# Patient Record
Sex: Male | Born: 1985 | Race: White | Hispanic: No | Marital: Single
Health system: Southern US, Community
[De-identification: ages and names within clinical notes are randomized; demographics above are authoritative.]

## PROBLEM LIST (undated history)

## (undated) DIAGNOSIS — I10 Essential (primary) hypertension: Secondary | ICD-10-CM

## (undated) DIAGNOSIS — D229 Melanocytic nevi, unspecified: Secondary | ICD-10-CM

## (undated) DIAGNOSIS — F411 Generalized anxiety disorder: Secondary | ICD-10-CM

## (undated) HISTORY — DX: Melanocytic nevi, unspecified: D22.9

## (undated) HISTORY — DX: Essential (primary) hypertension: I10

## (undated) HISTORY — DX: Generalized anxiety disorder: F41.1

---

## 2015-01-06 ENCOUNTER — Encounter: Payer: Self-pay | Admitting: Family Medicine

## 2015-01-06 ENCOUNTER — Ambulatory Visit (INDEPENDENT_AMBULATORY_CARE_PROVIDER_SITE_OTHER): Payer: BLUE CROSS/BLUE SHIELD | Admitting: Family Medicine

## 2015-01-06 VITALS — BP 146/87 | HR 120 | Ht 72.0 in | Wt 185.0 lb

## 2015-01-06 DIAGNOSIS — Z0000 Encounter for general adult medical examination without abnormal findings: Secondary | ICD-10-CM | POA: Diagnosis not present

## 2015-01-06 DIAGNOSIS — R000 Tachycardia, unspecified: Secondary | ICD-10-CM

## 2015-01-06 DIAGNOSIS — F411 Generalized anxiety disorder: Secondary | ICD-10-CM | POA: Diagnosis not present

## 2015-01-06 DIAGNOSIS — R030 Elevated blood-pressure reading, without diagnosis of hypertension: Secondary | ICD-10-CM

## 2015-01-06 DIAGNOSIS — D179 Benign lipomatous neoplasm, unspecified: Secondary | ICD-10-CM | POA: Diagnosis not present

## 2015-01-06 DIAGNOSIS — I10 Essential (primary) hypertension: Secondary | ICD-10-CM

## 2015-01-06 DIAGNOSIS — IMO0001 Reserved for inherently not codable concepts without codable children: Secondary | ICD-10-CM

## 2015-01-06 HISTORY — DX: Essential (primary) hypertension: I10

## 2015-01-06 HISTORY — DX: Generalized anxiety disorder: F41.1

## 2015-01-06 LAB — CBC
HCT: 44.9 % (ref 39.0–52.0)
HEMOGLOBIN: 15.3 g/dL (ref 13.0–17.0)
MCH: 30.8 pg (ref 26.0–34.0)
MCHC: 34.1 g/dL (ref 30.0–36.0)
MCV: 90.3 fL (ref 78.0–100.0)
MPV: 10.1 fL (ref 8.6–12.4)
Platelets: 290 10*3/uL (ref 150–400)
RBC: 4.97 MIL/uL (ref 4.22–5.81)
RDW: 13.1 % (ref 11.5–15.5)
WBC: 8.2 10*3/uL (ref 4.0–10.5)

## 2015-01-06 MED ORDER — LORAZEPAM 0.5 MG PO TABS: 0.5000 mg | ORAL_TABLET | Freq: Three times a day (TID) | ORAL | Status: DC

## 2015-01-06 NOTE — Assessment & Plan Note (Signed)
Likely related to anxiety. Recheck in one month

## 2015-01-06 NOTE — Patient Instructions (Addendum)
Thank you for coming in today. Return for a nurse visit next month to recheck Blood pressure.  I will call with lab results.    Consider cognitive behavioral therapy.  I'm familiar with the ones in [place] but not [place]. Research and I'll be happy to do a referral if needed   Generalized Anxiety Disorder Generalized anxiety disorder (GAD) is a mental disorder. It interferes with life functions, including relationships, work, and school. GAD is different from normal anxiety, which everyone experiences at some point in their lives in response to specific life events and activities. Normal anxiety actually helps [place] prepare for and get through these life events and activities. Normal anxiety goes away after the event or activity is over.  GAD causes anxiety that is not necessarily related to specific events or activities. It also causes excess anxiety in proportion to specific events or activities. The anxiety associated with GAD is also difficult to control. GAD can vary from mild to severe. People with severe GAD can have intense waves of anxiety with physical symptoms (panic attacks).  SYMPTOMS The anxiety and worry associated with GAD are difficult to control. This anxiety and worry are related to many life events and activities and also occur more days than not for 6 months or longer. People with GAD also have three or more of the following symptoms (one or more in children):  Restlessness.   Fatigue.  Difficulty concentrating.   Irritability.  Muscle tension.  Difficulty sleeping or unsatisfying sleep. DIAGNOSIS GAD is diagnosed through an assessment by your health care provider. Your health care provider will ask you questions aboutyour mood,physical symptoms, and events in your life. Your health care provider may ask you about your medical history and use of alcohol or drugs, including prescription medicines. Your health care provider may also do a  physical exam and blood tests. Certain medical conditions and the use of certain substances can cause symptoms similar to those associated with GAD. Your health care provider may refer you to a mental health specialist for further evaluation. TREATMENT The following therapies are usually used to treat GAD:   Medication. Antidepressant medication usually is prescribed for long-term daily control. Antianxiety medicines may be added in severe cases, especially when panic attacks occur.   Talk therapy (psychotherapy). Certain types of talk therapy can be helpful in treating GAD by providing support, education, and guidance. A form of talk therapy called cognitive behavioral therapy can teach you healthy ways to think about and react to daily life events and activities.  Stress managementtechniques. These include yoga, meditation, and exercise and can be very helpful when they are practiced regularly. A mental health specialist can help determine which treatment is best for you. Some people see improvement with one therapy. However, other people require a combination of therapies. Document Released: 07/30/2012 Document Revised: 08/19/2013 Document Reviewed: 07/30/2012 [facility] Patient Information 2015 [place], [place]. This information is not intended to replace advice given to you by your health care provider. Make sure you discuss any questions you have with your health care provider.

## 2015-01-06 NOTE — Assessment & Plan Note (Addendum)
Likely related to anxiety. EKG essentially normal today. Awaiting formal read. Check TSH

## 2015-01-06 NOTE — Assessment & Plan Note (Signed)
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 Plan for watchful waiting.

## 2015-01-06 NOTE — Assessment & Plan Note (Signed)
Refill lorazepam recommend counseling.

## 2015-01-06 NOTE — Progress Notes (Signed)
Jerome Turner is a 29 y.o. male who presents to [facility]  today for establish care.  Patient is here to establish care and discuss a mass on his back and his anxiety.  1) Back mass: Present for about a week. Patient is a mildly tender mobile nodule in his left SI area. He was seen by urgent care provider who suspected a lipoma. He denies any radiating pain weakness or numbness fevers or chills. He notes she's tried meloxicam and Flexeril which seemed to help a lot.  2) anxiety: Patient has been battling anxiety for multiple years now. He has tried multiple different SSRIs which caused intolerable side effects. He has never tried therapy. He takes lorazepam about once a week for his anxiety.    History reviewed. No pertinent past medical history. History reviewed. No pertinent past surgical history. Social History  Substance Use Topics  . Smoking status: Unknown If Ever Smoked  . Smokeless tobacco: Current User    Types: Chew  . Alcohol Use: No   family history includes Asthma in his maternal grandmother; Cancer in his maternal grandfather; Diabetes in his father, maternal grandfather, and paternal grandfather; Hearing loss in his father; Heart disease in his mother and paternal grandfather; Hypertension in his maternal grandfather and mother; Kidney disease in his paternal grandfather.  ROS as above Medications: Current Outpatient Prescriptions  Medication Sig Dispense Refill  . cyclobenzaprine (FLEXERIL) 10 MG tablet Take 10 mg by mouth 3 (three) times daily as needed for muscle spasms.    Marland Kitchen LORazepam (ATIVAN) 0.5 MG tablet Take 1 tablet (0.5 mg total) by mouth every 8 (eight) hours. 30 tablet 0  . meloxicam (MOBIC) 15 MG tablet Take 15 mg by mouth daily.     No current facility-administered medications for this visit.   Allergies  Allergen Reactions  . Penicillins   . Sulfanilamide      Exam:  BP 146/87 mmHg  Pulse 120  Ht 6' (1.829 m)   Wt 185 lb (83.915 kg)  BMI 25.08 kg/m2 Gen: Well NAD HEENT: EOMI,  MMM Lungs: Normal work of breathing. CTABL Heart: RRR no MRG heart rate at rest was 96 bpm  Abd: NABS, Soft. Nondistended, Nontender Exts: Brisk capillary refill, warm and well perfused.   back: Nontender to midline. Mildly tender mobile nodule left SI joint area. Normal back range of motion normal gait lower. Strength is equal and normal throughout. Psych: Alert and oriented normal affect normal thought process and speech. No SI or HI. GAD 7 was 15.  12-lead EKG shows normal sinus rhythm at 95 bpm. No ST segment elevation or depression. No significant Q waves. Normal EKG. RSR prime pattern likely normal.  No results found for this or any previous visit (from the past 24 hour(s)). No results found.   Please see individual assessment and plan sections.

## 2015-01-07 LAB — COMPLETE METABOLIC PANEL WITH GFR
ALBUMIN: 4.6 g/dL (ref 3.6–5.1)
ALT: 52 U/L — ABNORMAL HIGH (ref 9–46)
AST: 26 U/L (ref 10–40)
Alkaline Phosphatase: 77 U/L (ref 40–115)
BILIRUBIN TOTAL: 1 mg/dL (ref 0.2–1.2)
BUN: 14 mg/dL (ref 7–25)
CHLORIDE: 104 mmol/L (ref 98–110)
CO2: 28 mmol/L (ref 20–31)
CREATININE: 0.76 mg/dL (ref 0.60–1.35)
Calcium: 9.1 mg/dL (ref 8.6–10.3)
GFR, Est African American: >89 mL/min (ref >=60)
GFR, Est Non African American: >89 mL/min (ref >=60)
Glucose, Bld: 66 mg/dL (ref 65–99)
Potassium: 4 mmol/L (ref 3.5–5.3)
SODIUM: 140 mmol/L (ref 135–146)
Total Protein: 6.6 g/dL (ref 6.1–8.1)

## 2015-01-07 LAB — TSH: TSH: 0.944 u[IU]/mL (ref 0.350–4.500)

## 2015-01-07 NOTE — Progress Notes (Signed)
Quick Note:  Labs look normal. ALT is very mildly elevated. Plan to recheck in 3 months. This is likely nothing. ______

## 2016-02-15 ENCOUNTER — Encounter: Payer: Self-pay | Admitting: Family Medicine

## 2016-02-15 ENCOUNTER — Ambulatory Visit (INDEPENDENT_AMBULATORY_CARE_PROVIDER_SITE_OTHER): Payer: BLUE CROSS/BLUE SHIELD | Admitting: Family Medicine

## 2016-02-15 VITALS — BP 137/75 | HR 83 | Wt 198.0 lb

## 2016-02-15 DIAGNOSIS — M62838 Other muscle spasm: Secondary | ICD-10-CM

## 2016-02-15 DIAGNOSIS — S29012A Strain of muscle and tendon of back wall of thorax, initial encounter: Secondary | ICD-10-CM

## 2016-02-15 MED ORDER — METHOCARBAMOL 500 MG PO TABS: 500.0000 mg | ORAL_TABLET | Freq: Three times a day (TID) | ORAL | 0 refills | Status: DC

## 2016-02-15 MED ORDER — OMEPRAZOLE 40 MG PO CPDR: 40.0000 mg | DELAYED_RELEASE_CAPSULE | Freq: Every day | ORAL | 3 refills | Status: DC

## 2016-02-15 MED ORDER — DICLOFENAC SODIUM 75 MG PO TBEC: 75.0000 mg | DELAYED_RELEASE_TABLET | Freq: Two times a day (BID) | ORAL | 0 refills | Status: DC

## 2016-02-15 NOTE — Patient Instructions (Signed)
Thank you for coming in today. Attend Physical Therapy.  Return in 2-4 weeks if not better.  Return sooner if needed.  Use a heating pad.  Use a tens unit.  Come back or go to the emergency room if you notice new weakness new numbness problems walking or bowel or bladder problems.  TENS UNIT: This is helpful for muscle pain and spasm.   Search and Purchase a TENS 7000 2nd edition at www.tenspros.com. It should be less than $30.     TENS unit instructions: Do not shower or bathe with the unit on Turn the unit off before removing electrodes or batteries If the electrodes lose stickiness add a drop of water to the electrodes after they are disconnected from the unit and place on plastic sheet. If you continued to have difficulty, call the TENS unit company to purchase more electrodes. Do not apply lotion on the skin area prior to use. Make sure the skin is clean and dry as this will help prolong the life of the electrodes. After use, always check skin for unusual red areas, rash or other skin difficulties. If there are any skin problems, does not apply electrodes to the same area. Never remove the electrodes from the unit by pulling the wires. Do not use the TENS unit or electrodes other than as directed. Do not change electrode placement without consultating your therapist or physician. Keep 2 fingers with between each electrode. Wear time ratio is 2:1, on to off times.    For example on for 30 minutes off for 15 minutes and then on for 30 minutes off for 15 minutes    Cervical Strain and Sprain With Rehab Cervical strain and sprain are injuries that commonly occur with "whiplash" injuries. Whiplash occurs when the neck is forcefully whipped backward or forward, such as during a motor vehicle accident or during contact sports. The muscles, ligaments, tendons, discs, and nerves of the neck are susceptible to injury when this occurs. RISK FACTORS Risk of having a whiplash injury  increases if:  Osteoarthritis of the spine.  Situations that make head or neck accidents or trauma more likely.  High-risk sports (football, rugby, wrestling, hockey, auto racing, gymnastics, diving, contact karate, or boxing).  Poor strength and flexibility of the neck.  Previous neck injury.  Poor tackling technique.  Improperly fitted or padded equipment. SYMPTOMS   Pain or stiffness in the front or back of neck or both.  Symptoms may present immediately or up to 24 hours after injury.  Dizziness, headache, nausea, and vomiting.  Muscle spasm with soreness and stiffness in the neck.  Tenderness and swelling at the injury site. PREVENTION  Learn and use proper technique (avoid tackling with the head, spearing, and head-butting; use proper falling techniques to avoid landing on the head).  Warm up and stretch properly before activity.  Maintain physical fitness:  Strength, flexibility, and endurance.  Cardiovascular fitness.  Wear properly fitted and padded protective equipment, such as padded soft collars, for participation in contact sports. PROGNOSIS  Recovery from cervical strain and sprain injuries is dependent on the extent of the injury. These injuries are usually curable in 1 week to 3 months with appropriate treatment.  RELATED COMPLICATIONS   Temporary numbness and weakness may occur if the nerve roots are damaged, and this may persist until the nerve has completely healed.  Chronic pain due to frequent recurrence of symptoms.  Prolonged healing, especially if activity is resumed too soon (before complete recovery). TREATMENT  Treatment initially involves the use of ice and medication to help reduce pain and inflammation. It is also important to perform strengthening and stretching exercises and modify activities that worsen symptoms so the injury does not get worse. These exercises may be performed at home or with a therapist. For patients who experience  severe symptoms, a soft, padded collar may be recommended to be worn around the neck.  Improving your posture may help reduce symptoms. Posture improvement includes pulling your chin and abdomen in while sitting or standing. If you are sitting, sit in a firm chair with your buttocks against the back of the chair. While sleeping, try replacing your pillow with a small towel rolled to 2 inches in diameter, or use a cervical pillow or soft cervical collar. Poor sleeping positions delay healing.  For patients with nerve root damage, which causes numbness or weakness, the use of a cervical traction apparatus may be recommended. Surgery is rarely necessary for these injuries. However, cervical strain and sprains that are present at birth (congenital) may require surgery. MEDICATION   If pain medication is necessary, nonsteroidal anti-inflammatory medications, such as aspirin and ibuprofen, or other minor pain relievers, such as acetaminophen, are often recommended.  Do not take pain medication for 7 days before surgery.  Prescription pain relievers may be given if deemed necessary by your caregiver. Use only as directed and only as much as you need. HEAT AND COLD:   Cold treatment (icing) relieves pain and reduces inflammation. Cold treatment should be applied for 10 to 15 minutes every 2 to 3 hours for inflammation and pain and immediately after any activity that aggravates your symptoms. Use ice packs or an ice massage.  Heat treatment may be used prior to performing the stretching and strengthening activities prescribed by your caregiver, physical therapist, or athletic trainer. Use a heat pack or a warm soak. SEEK MEDICAL CARE IF:   Symptoms get worse or do not improve in 2 weeks despite treatment.  New, unexplained symptoms develop (drugs used in treatment may produce side effects). EXERCISES RANGE OF MOTION (ROM) AND STRETCHING EXERCISES - Cervical Strain and Sprain These exercises may help you  when beginning to rehabilitate your injury. In order to successfully resolve your symptoms, you must improve your posture. These exercises are designed to help reduce the forward-head and rounded-shoulder posture which contributes to this condition. Your symptoms may resolve with or without further involvement from your physician, physical therapist or athletic trainer. While completing these exercises, remember:   Restoring tissue flexibility helps normal motion to return to the joints. This allows healthier, less painful movement and activity.  An effective stretch should be held for at least 20 seconds, although you may need to begin with shorter hold times for comfort.  A stretch should never be painful. You should only feel a gentle lengthening or release in the stretched tissue. STRETCH- Axial Extensors  Lie on your back on the floor. You may bend your knees for comfort. Place a rolled-up hand towel or dish towel, about 2 inches in diameter, under the part of your head that makes contact with the floor.  Gently tuck your chin, as if trying to make a "double chin," until you feel a gentle stretch at the base of your head.  Hold __________ seconds. Repeat __________ times. Complete this exercise __________ times per day.  STRETCH - Axial Extension   Stand or sit on a firm surface. Assume a good posture: chest up, shoulders drawn back, abdominal  muscles slightly tense, knees unlocked (if standing) and feet hip width apart.  Slowly retract your chin so your head slides back and your chin slightly lowers. Continue to look straight ahead.  You should feel a gentle stretch in the back of your head. Be certain not to feel an aggressive stretch since this can cause headaches later.  Hold for __________ seconds. Repeat __________ times. Complete this exercise __________ times per day. STRETCH - Cervical Side Bend   Stand or sit on a firm surface. Assume a good posture: chest up, shoulders  drawn back, abdominal muscles slightly tense, knees unlocked (if standing) and feet hip width apart.  Without letting your nose or shoulders move, slowly tip your right / left ear to your shoulder until your feel a gentle stretch in the muscles on the opposite side of your neck.  Hold __________ seconds. Repeat __________ times. Complete this exercise __________ times per day. STRETCH - Cervical Rotators   Stand or sit on a firm surface. Assume a good posture: chest up, shoulders drawn back, abdominal muscles slightly tense, knees unlocked (if standing) and feet hip width apart.  Keeping your eyes level with the ground, slowly turn your head until you feel a gentle stretch along the back and opposite side of your neck.  Hold __________ seconds. Repeat __________ times. Complete this exercise __________ times per day. RANGE OF MOTION - Neck Circles   Stand or sit on a firm surface. Assume a good posture: chest up, shoulders drawn back, abdominal muscles slightly tense, knees unlocked (if standing) and feet hip width apart.  Gently roll your head down and around from the back of one shoulder to the back of the other. The motion should never be forced or painful.  Repeat the motion 10-20 times, or until you feel the neck muscles relax and loosen. Repeat __________ times. Complete the exercise __________ times per day. STRENGTHENING EXERCISES - Cervical Strain and Sprain These exercises may help you when beginning to rehabilitate your injury. They may resolve your symptoms with or without further involvement from your physician, physical therapist, or athletic trainer. While completing these exercises, remember:   Muscles can gain both the endurance and the strength needed for everyday activities through controlled exercises.  Complete these exercises as instructed by your physician, physical therapist, or athletic trainer. Progress the resistance and repetitions only as guided.  You may  experience muscle soreness or fatigue, but the pain or discomfort you are trying to eliminate should never worsen during these exercises. If this pain does worsen, stop and make certain you are following the directions exactly. If the pain is still present after adjustments, discontinue the exercise until you can discuss the trouble with your clinician. STRENGTH - Cervical Flexors, Isometric  Face a wall, standing about 6 inches away. Place a small pillow, a ball about 6-8 inches in diameter, or a folded towel between your forehead and the wall.  Slightly tuck your chin and gently push your forehead into the soft object. Push only with mild to moderate intensity, building up tension gradually. Keep your jaw and forehead relaxed.  Hold 10 to 20 seconds. Keep your breathing relaxed.  Release the tension slowly. Relax your neck muscles completely before you start the next repetition. Repeat __________ times. Complete this exercise __________ times per day. STRENGTH- Cervical Lateral Flexors, Isometric   Stand about 6 inches away from a wall. Place a small pillow, a ball about 6-8 inches in diameter, or a folded towel between  the side of your head and the wall.  Slightly tuck your chin and gently tilt your head into the soft object. Push only with mild to moderate intensity, building up tension gradually. Keep your jaw and forehead relaxed.  Hold 10 to 20 seconds. Keep your breathing relaxed.  Release the tension slowly. Relax your neck muscles completely before you start the next repetition. Repeat __________ times. Complete this exercise __________ times per day. STRENGTH - Cervical Extensors, Isometric   Stand about 6 inches away from a wall. Place a small pillow, a ball about 6-8 inches in diameter, or a folded towel between the back of your head and the wall.  Slightly tuck your chin and gently tilt your head back into the soft object. Push only with mild to moderate intensity, building up  tension gradually. Keep your jaw and forehead relaxed.  Hold 10 to 20 seconds. Keep your breathing relaxed.  Release the tension slowly. Relax your neck muscles completely before you start the next repetition. Repeat __________ times. Complete this exercise __________ times per day. POSTURE AND BODY MECHANICS CONSIDERATIONS - Cervical Strain and Sprain Keeping correct posture when sitting, standing or completing your activities will reduce the stress put on different body tissues, allowing injured tissues a chance to heal and limiting painful experiences. The following are general guidelines for improved posture. Your physician or physical therapist will provide you with any instructions specific to your needs. While reading these guidelines, remember:  The exercises prescribed by your provider will help you have the flexibility and strength to maintain correct postures.  The correct posture provides the optimal environment for your joints to work. All of your joints have less wear and tear when properly supported by a spine with good posture. This means you will experience a healthier, less painful body.  Correct posture must be practiced with all of your activities, especially prolonged sitting and standing. Correct posture is as important when doing repetitive low-stress activities (typing) as it is when doing a single heavy-load activity (lifting). PROLONGED STANDING WHILE SLIGHTLY LEANING FORWARD When completing a task that requires you to lean forward while standing in one place for a long time, place either foot up on a stationary 2- to 4-inch high object to help maintain the best posture. When both feet are on the ground, the low back tends to lose its slight inward curve. If this curve flattens (or becomes too large), then the back and your other joints will experience too much stress, fatigue more quickly, and can cause pain.  RESTING POSITIONS Consider which positions are most painful for  you when choosing a resting position. If you have pain with flexion-based activities (sitting, bending, stooping, squatting), choose a position that allows you to rest in a less flexed posture. You would want to avoid curling into a fetal position on your side. If your pain worsens with extension-based activities (prolonged standing, working overhead), avoid resting in an extended position such as sleeping on your stomach. Most people will find more comfort when they rest with their spine in a more neutral position, neither too rounded nor too arched. Lying on a non-sagging bed on your side with a pillow between your knees, or on your back with a pillow under your knees will often provide some relief. Keep in mind, being in any one position for a prolonged period of time, no matter how correct your posture, can still lead to stiffness. WALKING Walk with an upright posture. Your ears, shoulders, and hips  should all line up. OFFICE WORK When working at a desk, create an environment that supports good, upright posture. Without extra support, muscles fatigue and lead to excessive strain on joints and other tissues. CHAIR:  A chair should be able to slide under your desk when your back makes contact with the back of the chair. This allows you to work closely.  The chair's height should allow your eyes to be level with the upper part of your monitor and your hands to be slightly lower than your elbows.  Body position:  Your feet should make contact with the floor. If this is not possible, use a foot rest.  Keep your ears over your shoulders. This will reduce stress on your neck and low back.   This information is not intended to replace advice given to you by your health care provider. Make sure you discuss any questions you have with your health care provider.   Document Released: 04/04/2005 Document Revised: 04/25/2014 Document Reviewed: 07/17/2008 [employer] Interactive Patient Education [employer]

## 2016-02-15 NOTE — Progress Notes (Signed)
   Jerome Turner is a 30 y.o. male who presents to [facility] today for left shoulder pain. Patient is a 2 day history of left shoulder and back pain. Patient denies any injury or radiating pain weakness. He notes subjective numbness to his hand on the left. He thinks it's the entire hand that is involved. He notes pain is worse with neck motion and shoulder motion. No fevers chills nausea vomiting or diarrhea. He tried some leftover cyclobenzaprine and ibuprofen which did not help much.    Patient has a pertinent medical history for one episode of cervical spasm in the past. No past surgical history on file. Social History  Substance Use Topics  . Smoking status: Unknown If Ever Smoked  . Smokeless tobacco: Current User    Types: Chew  . Alcohol use No     ROS:  As above   Medications: Current Outpatient Prescriptions  Medication Sig Dispense Refill  . LORazepam (ATIVAN) 0.5 MG tablet Take 1 tablet (0.5 mg total) by mouth every 8 (eight) hours. 30 tablet 0  . diclofenac (VOLTAREN) 75 MG EC tablet Take 1 tablet (75 mg total) by mouth 2 (two) times daily. 30 tablet 0  . methocarbamol (ROBAXIN) 500 MG tablet Take 1 tablet (500 mg total) by mouth 3 (three) times daily. 90 tablet 0  . omeprazole (PRILOSEC) 40 MG capsule Take 1 capsule (40 mg total) by mouth daily. 30 capsule 3   No current facility-administered medications for this visit.    Allergies  Allergen Reactions  . Penicillins   . Sulfanilamide      Exam:  BP 137/75   Pulse 83   Wt 198 lb (89.8 kg)   BMI 26.85 kg/m  General: Well Developed, well nourished, and in no acute distress.  Neuro/Psych: Alert and oriented x3, extra-ocular muscles intact, able to move all 4 extremities, sensation grossly intact. Skin: Warm and dry, no rashes noted.  Respiratory: Not using accessory muscles, speaking in full sentences, trachea midline.  Cardiovascular: Pulses palpable, no extremity  edema. Abdomen: Does not appear distended. MSK: Thoracic and cervical spine is nontender to midline. Tender palpation left rhomboid and lower left cervical paraspinal muscle group and left trapezius. Cervical motion is limited especially in left lateral flexion and left rotation due to pain. Negative Spurling's test. Upper extremity strength sensation reflexes and motion are intact. Pulses are intact bilateral upper extremities. Shoulder normal-appearing and nontender. Mild pain with abduction beyond 120 Negative Hawkins and Neer's test. Mildly positive empty can test.     No results found for this or any previous visit (from the past 48 hour(s)). No results found.    Assessment and Plan: 30 y.o. male with cervical and trapezius spasm and pain due to myofascial disruption. Plan to treat with diclofenac and Robaxin along with heating pad TENS unit and a referral to physical therapy. I have prescribed omeprazole for GI prophylaxis while on diclofenac. Recommend patient return to clinic in 2-4 weeks or sooner if needed.    Orders Placed This Encounter  Procedures  . Ambulatory referral to Physical Therapy    Referral Priority:   Routine    Referral Type:   Physical Medicine    Referral Reason:   Specialty Services Required    Requested Specialty:   Physical Therapy    Number of Visits Requested:   1    Discussed warning signs or symptoms. Please see discharge instructions. Patient expresses understanding.

## 2016-05-10 ENCOUNTER — Ambulatory Visit (INDEPENDENT_AMBULATORY_CARE_PROVIDER_SITE_OTHER): Payer: BLUE CROSS/BLUE SHIELD | Admitting: Family Medicine

## 2016-05-10 ENCOUNTER — Encounter: Payer: Self-pay | Admitting: Family Medicine

## 2016-05-10 DIAGNOSIS — R197 Diarrhea, unspecified: Secondary | ICD-10-CM

## 2016-05-10 DIAGNOSIS — D2262 Melanocytic nevi of left upper limb, including shoulder: Secondary | ICD-10-CM

## 2016-05-10 MED ORDER — OMEPRAZOLE 40 MG PO CPDR: 40.0000 mg | DELAYED_RELEASE_CAPSULE | Freq: Every day | ORAL | 3 refills | Status: DC

## 2016-05-10 MED ORDER — DICYCLOMINE HCL 10 MG PO CAPS: 10.0000 mg | ORAL_CAPSULE | Freq: Three times a day (TID) | ORAL | 1 refills | Status: DC

## 2016-05-10 NOTE — Patient Instructions (Signed)
Thank you for coming in today. Take dicyclomine before meals. Get labs today or tomorrow.  Recheck in a few weeks if not better.   If your belly pain worsens, or you have high fever, bad vomiting, blood in your stool or black tarry stool go to the Emergency Room.    Diarrhea, Adult Diarrhea is frequent loose and watery bowel movements. Diarrhea can make you feel weak and cause you to become dehydrated. Dehydration can make you tired and thirsty, cause you to have a dry mouth, and decrease how often you urinate. Diarrhea typically lasts 2-3 days. However, it can last longer if it is a sign of something more serious. It is important to treat your diarrhea as told by your health care provider. Follow these instructions at home: Eating and drinking Follow these recommendations as told by your health care provider:  Take an oral rehydration solution (ORS). This is a drink that is sold at pharmacies and retail stores.  Drink clear fluids, such as water, ice chips, diluted fruit juice, and low-calorie sports drinks.  Eat bland, easy-to-digest foods in small amounts as you are able. These foods include bananas, applesauce, rice, lean meats, toast, and crackers.  Avoid drinking fluids that contain a lot of sugar or caffeine, such as energy drinks, sports drinks, and soda.  Avoid alcohol.  Avoid spicy or fatty foods. General instructions  Drink enough fluid to keep your urine clear or pale yellow.  Wash your hands often. If soap and water are not available, use hand sanitizer.  Make sure that all people in your household wash their hands well and often.  Take over-the-counter and prescription medicines only as told by your health care provider.  Rest at home while you recover.  Watch your condition for any changes.  Take a warm bath to relieve any burning or pain from frequent diarrhea episodes.  Keep all follow-up visits as told by your health care provider. This is  important. Contact a health care provider if:  You have a fever.  Your diarrhea gets worse.  You have new symptoms.  You cannot keep fluids down.  You feel light-headed or dizzy.  You have a headache  You have muscle cramps. Get help right away if:  You have chest pain.  You feel extremely weak or you faint.  You have bloody or black stools or stools that look like tar.  You have severe pain, cramping, or bloating in your abdomen.  You have trouble breathing or you are breathing very quickly.  Your heart is beating very quickly.  Your skin feels cold and clammy.  You feel confused.  You have signs of dehydration, such as:  Dark urine, very little urine, or no urine.  Cracked lips.  Dry mouth.  Sunken eyes.  Sleepiness.  Weakness. This information is not intended to replace advice given to you by your health care provider. Make sure you discuss any questions you have with your health care provider. Document Released: 03/25/2002 Document Revised: 08/13/2015 Document Reviewed: 12/09/2014 Elsevier Interactive Patient Education  2017 [employer].

## 2016-05-11 DIAGNOSIS — D229 Melanocytic nevi, unspecified: Secondary | ICD-10-CM | POA: Insufficient documentation

## 2016-05-11 HISTORY — DX: Melanocytic nevi, unspecified: D22.9

## 2016-05-11 NOTE — Progress Notes (Signed)
       Jerome Turner is a 31 y.o. male who presents to [facility]: [facility] [facility] today for diarrhea: Patient is a three-day history of frequent episodes of diarrhea. He notes that he has a bowel movement after he eats. He denies any travel or recent illness fevers or chills. He has had intestinal issues in the past but nothing like this. He denies any antibiotic exposure or sick contacts area he's tried taking Pepto-Bismol which did not help much. He continues to take omeprazole for acid reflux which helps quite a bit.  Additionally he notes a mole on his left thoracic back that he would like to be evaluated. He's not sure how long it's been there   No past medical history on file. No past surgical history on file. Social History  Substance Use Topics  . Smoking status: Unknown If Ever Smoked  . Smokeless tobacco: Current User    Types: Chew  . Alcohol use No   family history includes Asthma in his maternal grandmother; Cancer in his maternal grandfather; Diabetes in his father, maternal grandfather, and paternal grandfather; Hearing loss in his father; Heart disease in his mother and paternal grandfather; Hypertension in his maternal grandfather and mother; Kidney disease in his paternal grandfather.  ROS as above:  Medications: Current Outpatient Prescriptions  Medication Sig Dispense Refill  . dicyclomine (BENTYL) 10 MG capsule Take 1 capsule (10 mg total) by mouth 4 (four) times daily -  before meals and at bedtime. 120 capsule 1  . omeprazole (PRILOSEC) 40 MG capsule Take 1 capsule (40 mg total) by mouth daily. 90 capsule 3   No current facility-administered medications for this visit.    Allergies  Allergen Reactions  . Penicillins   . Sulfanilamide     Health Maintenance Health Maintenance  Topic Date Due  . HIV Screening  05/02/2001  . TETANUS/TDAP  05/02/2005  .  INFLUENZA VACCINE  11/17/2015     Exam:  BP 122/79   Pulse 91   Temp 98.4 F (36.9 C) (Oral)   Wt 191 lb (86.6 kg)   SpO2 98%   BMI 25.90 kg/m  Gen: Well NAD Nontoxic appearing HEENT: EOMI,  MMM Lungs: Normal work of breathing. CTABL Heart: RRR no MRG Abd: NABS, Soft. Nondistended, Nontender no masses palpated. Exts: Brisk capillary refill, warm and well perfused.  Skin: Small less than 4 mm in diameter slightly erythematous macular lesion with regular borders. Located on the thoracic left back near the scapula.   No results found for this or any previous visit (from the past 72 hour(s)). No results found.    Assessment and Plan: 30 y.o. male with  Diarrhea: The differential at this time is quite broad including infectious etiology, inflammatory bowel disease, malabsorptive process, and IBS. Patient is largely well appearing however. Plan for the workup listed below as well as trial of dicyclomine for presumed IBS. Recheck in a few weeks or sooner if worsening.   Orders Placed This Encounter  Procedures  . Stool culture  . Stool C-Diff Toxin Assay  . CBC  . COMPLETE METABOLIC PANEL WITH GFR  . Lipase  . IgG, IgA, IgM  . Tissue transglutaminase, IgA    Discussed warning signs or symptoms. Please see discharge instructions. Patient expresses understanding.

## 2016-05-25 LAB — COMPLETE METABOLIC PANEL WITH GFR
ALT: 28 U/L (ref 9–46)
AST: 20 U/L (ref 10–40)
Albumin: 4.6 g/dL (ref 3.6–5.1)
Alkaline Phosphatase: 77 U/L (ref 40–115)
BUN: 11 mg/dL (ref 7–25)
CO2: 25 mmol/L (ref 20–31)
CREATININE: 0.75 mg/dL (ref 0.60–1.35)
Calcium: 9.7 mg/dL (ref 8.6–10.3)
Chloride: 103 mmol/L (ref 98–110)
GFR, Est African American: >89 mL/min (ref >=60)
GFR, Est Non African American: >89 mL/min (ref >=60)
Glucose, Bld: 88 mg/dL (ref 65–99)
Potassium: 4.3 mmol/L (ref 3.5–5.3)
Sodium: 141 mmol/L (ref 135–146)
Total Bilirubin: 0.7 mg/dL (ref 0.2–1.2)
Total Protein: 6.9 g/dL (ref 6.1–8.1)

## 2016-05-25 LAB — CBC
HEMATOCRIT: 48.6 % (ref 38.5–50.0)
Hemoglobin: 16.2 g/dL (ref 13.2–17.1)
MCH: 30.5 pg (ref 27.0–33.0)
MCHC: 33.3 g/dL (ref 32.0–36.0)
MCV: 91.5 fL (ref 80.0–100.0)
MPV: 10.8 fL (ref 7.5–12.5)
PLATELETS: 273 10*3/uL (ref 140–400)
RBC: 5.31 MIL/uL (ref 4.20–5.80)
RDW: 12.8 % (ref 11.0–15.0)
WBC: 9 10*3/uL (ref 3.8–10.8)

## 2016-05-25 LAB — LIPASE: Lipase: 16 U/L (ref 7–60)

## 2016-05-25 LAB — TISSUE TRANSGLUTAMINASE, IGA: TISSUE TRANSGLUTAMINASE AB, IGA: 1 U/mL (ref <4)

## 2016-05-25 LAB — IGG, IGA, IGM
IgA: 474 mg/dL — ABNORMAL HIGH (ref 81–463)
IgG (Immunoglobin G), Serum: 700 mg/dL (ref 694–1618)
IgM, Serum: 28 mg/dL — ABNORMAL LOW (ref 48–271)

## 2016-08-25 ENCOUNTER — Emergency Department (INDEPENDENT_AMBULATORY_CARE_PROVIDER_SITE_OTHER)
Admission: EM | Admit: 2016-08-25 | Discharge: 2016-08-25 | Disposition: A | Discharge status: Home / Self Care | Payer: BLUE CROSS/BLUE SHIELD | Source: Home / Self Care | Attending: Family Medicine | Admitting: Family Medicine

## 2016-08-25 ENCOUNTER — Other Ambulatory Visit: Payer: Self-pay

## 2016-08-25 ENCOUNTER — Emergency Department (INDEPENDENT_AMBULATORY_CARE_PROVIDER_SITE_OTHER): Payer: BLUE CROSS/BLUE SHIELD

## 2016-08-25 ENCOUNTER — Encounter: Payer: Self-pay | Admitting: Emergency Medicine

## 2016-08-25 DIAGNOSIS — R079 Chest pain, unspecified: Secondary | ICD-10-CM | POA: Diagnosis not present

## 2016-08-25 DIAGNOSIS — R0789 Other chest pain: Secondary | ICD-10-CM

## 2016-08-25 DIAGNOSIS — R0602 Shortness of breath: Secondary | ICD-10-CM

## 2016-08-25 DIAGNOSIS — F410 Panic disorder [episodic paroxysmal anxiety] without agoraphobia: Secondary | ICD-10-CM

## 2016-08-25 DIAGNOSIS — R002 Palpitations: Secondary | ICD-10-CM

## 2016-08-25 LAB — POCT FASTING CBG KUC MANUAL ENTRY: POCT Glucose (KUC): 109 mg/dL — AB (ref 70–99)

## 2016-08-25 MED ORDER — CYCLOBENZAPRINE HCL 10 MG PO TABS: 10.0000 mg | ORAL_TABLET | Freq: Two times a day (BID) | ORAL | 0 refills | Status: DC | PRN

## 2016-08-25 MED ORDER — GI COCKTAIL ~~LOC~~
30.0000 mL | Freq: Once | ORAL | Status: AC
  Administered 2016-08-25: 30 mL via ORAL

## 2016-08-25 MED ORDER — ASPIRIN 325 MG PO TABS
325.0000 mg | ORAL_TABLET | Freq: Once | ORAL | Status: AC
  Administered 2016-08-25: 325 mg via ORAL

## 2016-08-25 MED ORDER — LORAZEPAM 0.5 MG PO TABS: 0.5000 mg | ORAL_TABLET | Freq: Three times a day (TID) | ORAL | 0 refills | Status: DC | PRN

## 2016-08-25 NOTE — Discharge Instructions (Signed)
°  Lorazepam (Ativan) is a benzodiazepine to help manage symptoms of anxiety. While taking, do not drink alcohol, drive, or perform any other activities that requires focus while taking this medication as it can cause drowsiness.  Do not share this medication with others as this could cause dangerous and deadly side effects.  Do not take more than prescribed.

## 2016-08-25 NOTE — ED Triage Notes (Signed)
Chest pain started about one hour ago, left side

## 2016-08-25 NOTE — ED Provider Notes (Signed)
CSN: [id number]     Arrival date & time 08/25/16  1125 History   First MD Initiated Contact with Patient 08/25/16 1126     Chief Complaint  Patient presents with  . Chest Pain   (Consider location/radiation/quality/duration/timing/severity/associated sxs/prior Treatment) HPI Jerome Turner is a 31 y.o. male presenting to UC with c/o sudden onset sharp Left side chest pain that is 8/10 in severity. It started about 10 minutes PTA, worse with pressing on the Left side of lower anterior chest.  Pt notes he was at work when symptoms started. He does not recall feeling stressed or doing anything strenuous at the time. He does have a hx of anxiety but notes he has never had chest pain with panic attacks in the past.  He also has some SOB and feels dizzy, slightly light headed.  He drank 1 cup of coffee today and has not had anything to eat yet, which is normal for him to skip breakfast.  He currently takes Prilosec but no other daily medications.  He has not tried any medications for current pain.  No known heart problems. His grandfather died of a heart attack when he was over 28 years old.   He has f/u with his PCP routinely and has had normal workups after his anxiety attacks in the past.   He does recall working out more recently and bench pressing 3 days in a row but denies specific pain or injury during those workouts.   Last mention of anxiety/panic attacks, per medical records, was in 2016.  He was taking lorazepam as needed at that time.  He has not taken that in several years now. 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  He has sense started a new job working out of the house, which has helped with his depression and anxiety.   Denies leg pain or swelling. Denies hx of blood clots. No recent travel. No recent change in routine. He has not felt sick recently.    History reviewed. No pertinent past medical  history. History reviewed. No pertinent surgical history. Family History  Problem Relation Age of Onset  . Heart disease Mother   . Hypertension Mother   . Diabetes Father   . Hearing loss Father   . Asthma Maternal Grandmother   . Hypertension Maternal Grandfather   . Diabetes Maternal Grandfather   . Cancer Maternal Grandfather   . Diabetes Paternal Grandfather   . Heart disease Paternal Grandfather   . Kidney disease Paternal Grandfather    Social History  Substance Use Topics  . Smoking status: Unknown If Ever Smoked  . Smokeless tobacco: Current User    Types: Chew  . Alcohol use No    Review of Systems  Constitutional: Negative for chills and fever.  HENT: Negative for congestion, ear pain, sore throat, trouble swallowing and voice change.   Respiratory: Positive for chest tightness and shortness of breath. Negative for cough.   Cardiovascular: Positive for chest pain ( Left side) and palpitations ( "heart racing").  Gastrointestinal: Negative for abdominal pain, diarrhea, nausea and vomiting.  Musculoskeletal: Negative for arthralgias, back pain and myalgias.  Skin: Negative for rash.  Neurological: Positive for dizziness and light-headedness. Negative for headaches.    Allergies  Penicillins and Sulfanilamide  Home Medications   Prior to Admission medications   Medication Sig Start Date End Date Taking? Authorizing Provider  cyclobenzaprine (FLEXERIL) 10 MG  tablet Take 1 tablet (10 mg total) by mouth 2 (two) times daily as needed. 08/25/16   Noland Fordyce, PA-C  dicyclomine (BENTYL) 10 MG capsule Take 1 capsule (10 mg total) by mouth 4 (four) times daily -  before meals and at bedtime. 05/10/16   Gregor Hams, MD  LORazepam (ATIVAN) 0.5 MG tablet Take 1-2 tablets (0.5-1 mg total) by mouth every 8 (eight) hours as needed for anxiety. 08/25/16   Noland Fordyce, PA-C  omeprazole (PRILOSEC) 40 MG capsule Take 1 capsule (40 mg total) by mouth daily. 05/10/16   Gregor Hams, MD   Meds Ordered and Administered this Visit   Medications  gi cocktail (Maalox,Lidocaine,Donnatal) (30 mLs Oral Given 08/25/16 1224)  aspirin tablet 325 mg (325 mg Oral Given 08/25/16 1224)    BP 121/87 (BP Location: Left Arm)   Pulse 85   Temp 98.2 F (36.8 C) (Oral)   Ht 6' (1.829 m)   Wt 182 lb (82.6 kg)   SpO2 100%   BMI 24.68 kg/m  No data found.   Physical Exam  Constitutional: He is oriented to person, place, and time. He appears well-developed and well-nourished.  Pt lying on exam bed, appears anxious but is alert and cooperative during exam.   HENT:  Head: Normocephalic and atraumatic.  Mouth/Throat: Oropharynx is clear and moist.  Eyes: EOM are normal.  Neck: Normal range of motion. Neck supple.  Cardiovascular: Regular rhythm.  Tachycardia present.   Pulmonary/Chest: Effort normal and breath sounds normal. No respiratory distress. He has no wheezes. He has no rales. He exhibits tenderness (Left anterior chest).    Abdominal: Soft. He exhibits no distension and no mass. There is no tenderness. There is no rebound and no guarding.  Musculoskeletal: Normal range of motion.  Neurological: He is alert and oriented to person, place, and time.  Skin: Skin is warm and dry. No rash noted. He is not diaphoretic. No erythema.  Psychiatric: His behavior is normal. His mood appears anxious.  Nursing note and vitals reviewed.   Urgent Care Course     Procedures (including critical care time)  Labs Review Labs Reviewed  POCT FASTING CBG [facility] MANUAL ENTRY - Abnormal; Notable for the following:       Result Value   POCT Glucose ([facility]) 109 (*)    All other components within normal limits    Imaging Review Dg Chest 2 View  Result Date: 08/25/2016 CLINICAL DATA:  Left chest wall pain.  Shortness of breath. EXAM: CHEST  2 VIEW COMPARISON:  None. FINDINGS: Normal heart size and mediastinal contours. No acute infiltrate or edema. No effusion or pneumothorax. No  acute osseous findings. IMPRESSION: Negative chest. Electronically Signed   By: Monte Fantasia M.D.   On: 08/25/2016 12:07    Date/Time:08/25/2016    11:31:08 Ventricular Rate: 94 PR Interval: 142 QRS Duration: 92 QT Interval: 352 QTC Calculation: 440 P-R-T axes:  51   75   38 Text Interpretation: Normal sinus rhythm, Normal EKG.    MDM   1. Chest pain, unspecified type   2. Left-sided chest wall pain   3. Palpitations   4. Anxiety attack    Pt c/o Left side chest pain that is worse with deep breathing and palpation. Constant since onset around 11:15AM.   Pulse: 96bmp  O2 Sat 100% on RA. Doubt PE.  CP atypical for ACS  EKG: WNL  Pt given aspirin and GI cocktail for pain.  CXR: normal  No  improvement in pain but BP and HR are improving. He notes he no longer feels dizzy or palpitations. Pt given ice pack.   Reassured pt of normal workup in UC.  Rx: flexeril (for possible muscle spasm) and ativan 0.5mg  Encouraged f/u with PCP as needed for recurrent panic attacks.  Discussed symptoms that warrant emergent care in the ED.    Noland Fordyce, PA-C 08/25/16 1231

## 2016-08-27 ENCOUNTER — Telehealth: Payer: Self-pay | Admitting: Emergency Medicine

## 2016-08-27 NOTE — Telephone Encounter (Signed)
Feeling better; has appt. With his pcp in 2 days.

## 2016-08-29 ENCOUNTER — Ambulatory Visit (INDEPENDENT_AMBULATORY_CARE_PROVIDER_SITE_OTHER): Payer: BLUE CROSS/BLUE SHIELD | Admitting: Family Medicine

## 2016-08-29 ENCOUNTER — Encounter: Payer: Self-pay | Admitting: Family Medicine

## 2016-08-29 DIAGNOSIS — H9191 Unspecified hearing loss, right ear: Secondary | ICD-10-CM | POA: Insufficient documentation

## 2016-08-29 DIAGNOSIS — F410 Panic disorder [episodic paroxysmal anxiety] without agoraphobia: Secondary | ICD-10-CM | POA: Diagnosis not present

## 2016-08-29 MED ORDER — LORATADINE 10 MG PO TABS: 10.0000 mg | ORAL_TABLET | Freq: Every day | ORAL | 3 refills | Status: AC

## 2016-08-29 MED ORDER — FLUTICASONE PROPIONATE 50 MCG/ACT NA SUSP: 2.0000 | Freq: Every day | NASAL | 12 refills | Status: AC

## 2016-08-29 MED ORDER — CLONAZEPAM 0.5 MG PO TABS: 0.5000 mg | ORAL_TABLET | Freq: Two times a day (BID) | ORAL | 0 refills | Status: DC | PRN

## 2016-08-29 NOTE — Progress Notes (Signed)
Jerome Turner. is a 31 y.o. male who presents to [facility]: [place] today for chest pain episode. Patient was recently seen in the emergency room for an episode of chest pain thought to be panic attack. He had a normal workup then it was prescribed Ativan. He notes the Ativan has been extremely helpful for anxiety control. He has a history of anxiety disorder previously. He's had multiple different SSRIs including Zoloft and Prozac. None of these have worked very well-oriented been intolerable.  He is feeling much better and denies any current episode of chest pain or shortness of breath palpitations lightheadedness or dizziness. He notes his anxiety is much better controlled now than it had been taking Ativan 1 a day.   Additionally patient notes that he like a refill of Flonase and Claritin that he takes regularly.  Past Medical History:  Diagnosis Date  . GAD (generalized anxiety disorder) 01/06/2015   No past surgical history on file. Social History  Substance Use Topics  . Smoking status: Unknown If Ever Smoked  . Smokeless tobacco: Current User    Types: Chew  . Alcohol use No   family history includes Asthma in his maternal grandmother; Cancer in his maternal grandfather; Diabetes in his father, maternal grandfather, and paternal grandfather; Hearing loss in his father; Heart disease in his mother and paternal grandfather; Hypertension in his maternal grandfather and mother; Kidney disease in his paternal grandfather.  ROS as above:  Medications: Current Outpatient Prescriptions  Medication Sig Dispense Refill  . LORazepam (ATIVAN) 0.5 MG tablet Take 1-2 tablets (0.5-1 mg total) by mouth every 8 (eight) hours as needed for anxiety. 20 tablet 0  . omeprazole (PRILOSEC) 40 MG capsule Take 1 capsule (40 mg total) by mouth daily. 90 capsule 3  . clonazePAM (KLONOPIN)  0.5 MG tablet Take 1 tablet (0.5 mg total) by mouth 2 (two) times daily as needed for anxiety. 60 tablet 0  . fluticasone (FLONASE) 50 MCG/ACT nasal spray Place 2 sprays into both nostrils daily. 16 g 12  . loratadine (CLARITIN) 10 MG tablet Take 1 tablet (10 mg total) by mouth daily. 90 tablet 3   No current facility-administered medications for this visit.    Allergies  Allergen Reactions  . Penicillins   . Sulfanilamide     Health Maintenance Health Maintenance  Topic Date Due  . TETANUS/TDAP  09/22/2016 (Originally 05/02/2005)  . HIV Screening  09/14/2017 (Originally 05/02/2001)  . INFLUENZA VACCINE  11/16/2016     Exam:  BP 128/81   Pulse 91   SpO2 99%   Gen: Well NAD HEENT: EOMI,  MMM Lungs: Normal work of breathing. CTABL Heart: RRR no MRG Abd: NABS, Soft. Nondistended, Nontender Exts: Brisk capillary refill, warm and well perfused.  Psych alert and oriented normal speech the process and affect    No results found for this or any previous visit (from the past 72 hour(s)). No results found.    Assessment and Plan: 31 y.o. male with anxiety disorder. I do not think that the chest pain episode was cardiac in nature. I think it probably was a panic attack. Plan to switch from Ativan to Klonopin. Additionally we'll check a [employer] panel to see which SSRIs like medication will be better for him as he said multiple different medications that he did not tolerate. Plan to recheck in a few weeks.   No orders of the defined types were placed in  this encounter.  Meds ordered this encounter  Medications  . clonazePAM (KLONOPIN) 0.5 MG tablet    Sig: Take 1 tablet (0.5 mg total) by mouth 2 (two) times daily as needed for anxiety.    Dispense:  60 tablet    Refill:  0  . fluticasone (FLONASE) 50 MCG/ACT nasal spray    Sig: Place 2 sprays into both nostrils daily.    Dispense:  16 g    Refill:  12  . loratadine (CLARITIN) 10 MG tablet    Sig: Take 1 tablet (10 mg  total) by mouth daily.    Dispense:  90 tablet    Refill:  3     Discussed warning signs or symptoms. Please see discharge instructions. Patient expresses understanding.

## 2016-08-29 NOTE — Patient Instructions (Signed)
Thank you for coming in today. STOP ativan.  Start klonopin in the morning and evening.  Get the genetic test to see what SSRI (or other) medicine may best for you.  I am considering Trintelix or Viibryd.   Please get a list of medicines you have taken in the past for anxiety.   Recheck in 3-4 weeks.  Panic Attacks Panic attacks are sudden, short-livedsurges of severe anxiety, fear, or discomfort. They may occur for no reason when you are relaxed, when you are anxious, or when you are sleeping. Panic attacks may occur for a number of reasons:  Healthy people occasionally have panic attacks in extreme, life-threatening situations, such as war or natural disasters. Normal anxiety is a protective mechanism of the body that helps [place] react to danger (fight or flight response).  Panic attacks are often seen with anxiety disorders, such as panic disorder, social anxiety disorder, generalized anxiety disorder, and phobias. Anxiety disorders cause excessive or uncontrollable anxiety. They may interfere with your relationships or other life activities.  Panic attacks are sometimes seen with other mental illnesses, such as depression and posttraumatic stress disorder.  Certain medical conditions, prescription medicines, and drugs of abuse can cause panic attacks. What are the signs or symptoms? Panic attacks start suddenly, peak within 20 minutes, and are accompanied by four or more of the following symptoms:  Pounding heart or fast heart rate (palpitations).  Sweating.  Trembling or shaking.  Shortness of breath or feeling smothered.  Feeling choked.  Chest pain or discomfort.  Nausea or strange feeling in your stomach.  Dizziness, light-headedness, or feeling like you will faint.  Chills or hot flushes.  Numbness or tingling in your lips or hands and feet.  Feeling that things are not real or feeling that you are not yourself.  Fear of losing control or going crazy.  Fear of  dying. Some of these symptoms can mimic serious medical conditions. For example, you may think you are having a heart attack. Although panic attacks can be very scary, they are not life threatening. How is this diagnosed? Panic attacks are diagnosed through an assessment by your health care provider. Your health care provider will ask questions about your symptoms, such as where and when they occurred. Your health care provider will also ask about your medical history and use of alcohol and drugs, including prescription medicines. Your health care provider may order blood tests or other studies to rule out a serious medical condition. Your health care provider may refer you to a mental health professional for further evaluation. How is this treated?  Most healthy people who have one or two panic attacks in an extreme, life-threatening situation will not require treatment.  The treatment for panic attacks associated with anxiety disorders or other mental illness typically involves counseling with a mental health professional, medicine, or a combination of both. Your health care provider will help determine what treatment is best for you.  Panic attacks due to physical illness usually go away with treatment of the illness. If prescription medicine is causing panic attacks, talk with your health care provider about stopping the medicine, decreasing the dose, or substituting another medicine.  Panic attacks due to alcohol or drug abuse go away with abstinence. Some adults need professional help in order to stop drinking or using drugs. Follow these instructions at home:  Take all medicines as directed by your health care provider.  Schedule and attend follow-up visits as directed by your health care provider. It  is important to keep all your appointments. Contact a health care provider if:  You are not able to take your medicines as prescribed.  Your symptoms do not improve or get worse. Get help  right away if:  You experience panic attack symptoms that are different than your usual symptoms.  You have serious thoughts about hurting yourself or others.  You are taking medicine for panic attacks and have a serious side effect. This information is not intended to replace advice given to you by your health care provider. Make sure you discuss any questions you have with your health care provider. Document Released: 04/04/2005 Document Revised: 09/10/2015 Document Reviewed: 11/16/2012 Elsevier Interactive Patient Education  2017 [employer].

## 2016-09-08 ENCOUNTER — Encounter: Payer: Self-pay | Admitting: Family Medicine

## 2016-09-29 ENCOUNTER — Ambulatory Visit (INDEPENDENT_AMBULATORY_CARE_PROVIDER_SITE_OTHER): Payer: BLUE CROSS/BLUE SHIELD | Admitting: Family Medicine

## 2016-09-29 ENCOUNTER — Encounter: Payer: Self-pay | Admitting: Family Medicine

## 2016-09-29 VITALS — BP 135/80 | HR 79 | Ht 72.0 in | Wt 181.0 lb

## 2016-09-29 DIAGNOSIS — F410 Panic disorder [episodic paroxysmal anxiety] without agoraphobia: Secondary | ICD-10-CM

## 2016-09-29 DIAGNOSIS — F411 Generalized anxiety disorder: Secondary | ICD-10-CM | POA: Diagnosis not present

## 2016-09-29 MED ORDER — CLONAZEPAM 0.5 MG PO TABS: 0.5000 mg | ORAL_TABLET | Freq: Two times a day (BID) | ORAL | 2 refills | Status: DC | PRN

## 2016-09-29 MED ORDER — VILAZODONE HCL 10 & 20 MG PO KIT: 1.0000 | PACK | Freq: Every day | ORAL | 0 refills | Status: DC

## 2016-09-29 NOTE — Patient Instructions (Signed)
Thank you for coming in today. Continue klonopin twice daily.  Start viibryd starter pack.  Recheck in 1 month.   Vilazodone oral tablet What is this medicine? VILAZODONE (vil [place] oh done) is used to treat depression. This medicine may be used for other purposes; ask your health care provider or pharmacist if you have questions. COMMON BRAND NAME(S): VIIBRYD What should I tell my health care provider before I take this medicine? They need to know if you have any of these conditions: -bipolar disorder or a family history of bipolar disorder -glaucoma -liver disease -low levels of sodium in the blood -receiving electroconvulsive therapy -seizures (convulsions) -suicidal thoughts, plans, or attempt by you or a family member -an unusual or allergic reaction to vilazodone, other medicines, foods, dyes or preservatives -pregnant or trying to get pregnant -breast-feeding How should I use this medicine? Take this medicine by mouth with a glass of water. Follow the directions on the prescription label. Take this medicine with food. Take your medicine at regular intervals. Do not take your medicine more often than directed. Do not stop taking this medicine suddenly except upon the advice of your doctor. Stopping this medicine too quickly may cause serious side effects or your condition may worsen. A special MedGuide will be given to you by the pharmacist with each prescription and refill. Be sure to read this information carefully each time. Overdosage: If you think you have taken too much of this medicine contact a poison control center or emergency room at once. NOTE: This medicine is only for you. Do not share this medicine with others. What if I miss a dose? If you miss a dose, take it as soon as you can. If it is almost time for your next dose, take only that dose. Do not take double or extra doses. What may interact with this medicine? Do not take this medicine with any of the following  medications: -linezolid -MAOIs like Carbex, Eldepryl, Marplan, Nardil, and Parnate -methylene blue (injected into a vein) This medicine may also interact with the following medications: -amphetamines -aspirin and aspirin-like medicines -buspirone -certain diet drugs like dexfenfluramine, fenfluramine, phentermine, sibutramine -certain migraine headache medicines like almotriptan, eletriptan, frovatriptan, naratriptan, rizatriptan, sumatriptan, zolmitriptan -certain medicines that treat or prevent blood clots like warfarin, enoxaparin, and dalteparin -certain medicines that treat infections like clarithromycin, itraconazole, voriconazole, ketoconazole, rifampin -certain medicines that treat seizures like carbamazepine and phenytoin -digoxin -fentanyl -lithium -NSAIDS, medicines for pain and inflammation, like ibuprofen or naproxen -other medicines for depression, anxiety, or psychotic disturbances -St. John's Wort -tramadol -tryptophan This list may not describe all possible interactions. Give your health care provider a list of all the medicines, herbs, non-prescription drugs, or dietary supplements you use. Also tell them if you smoke, drink alcohol, or use illegal drugs. Some items may interact with your medicine. What should I watch for while using this medicine? Tell your doctor if your symptoms do not get better or if they get worse. Visit your doctor or health care professional for regular checks on your progress. Because it may take several weeks to see the full effects of this medicine, it is important to continue your treatment as prescribed by your doctor. Patients and their families should watch out for new or worsening thoughts of suicide or depression. Also watch out for sudden changes in feelings such as feeling anxious, agitated, panicky, irritable, hostile, aggressive, impulsive, severely restless, overly excited and hyperactive, or not being able to sleep. If this happens,  especially at  the beginning of treatment or after a change in dose, call your health care professional. Dennis Bast may get drowsy or dizzy. Do not drive, use machinery, or do anything that needs mental alertness until you know how this medicine affects you. Do not stand or sit up quickly, especially if you are an older patient. This reduces the risk of dizzy or fainting spells. Alcohol may interfere with the effect of this medicine. Avoid alcoholic drinks. Your mouth may get dry. Chewing sugarless gum or sucking hard candy, and drinking plenty of water may help. Contact your doctor if the problem does not go away or is severe. What side effects may I notice from receiving this medicine? Side effects that you should report to your doctor or health care professional as soon as possible: -allergic reactions like skin rash, itching or hives, swelling of the face, lips, or tongue -anxious -black, tarry stools -changes in vision -confusion -elevated mood, decreased need for sleep, racing thoughts, impulsive behavior -eye pain -fast, irregular heartbeat -feeling faint or lightheaded, falls -feeling agitated, angry, or irritable -hallucination, loss of contact with reality -loss of balance or coordination -loss of memory -restlessness, pacing, inability to keep still -seizures -stiff muscles -suicidal thoughts or other mood changes -trouble sleeping -unusual bleeding or bruising -unusually weak or tired -vomiting Side effects that usually do not require medical attention (report to your doctor or health care professional if they continue or are bothersome): -change in appetite or weight -change in sex drive or performance -diarrhea -drowsiness -dry mouth -increased sweating -nausea -tremors This list may not describe all possible side effects. Call your doctor for medical advice about side effects. You may report side effects to FDA at 1-800-FDA-1088. Where should I keep my medicine? Keep out  of the reach of children. Store at room temperature between 15 and 30 degrees C (59 to 86 degrees F). Throw away any unused medicine after the expiration date. NOTE: This sheet is a summary. It may not cover all possible information. If you have questions about this medicine, talk to your doctor, pharmacist, or health care provider.  2018 Elsevier/Gold Standard (2015-12-01 12:50:48)   Panic Attacks Panic attacks are sudden, short feelings of great fear or discomfort. You may have them for no reason when you are relaxed, when you are uneasy (anxious), or when you are sleeping. Follow these instructions at home:  Take all your medicines as told.  Check with your doctor before starting new medicines.  Keep all doctor visits. Contact a doctor if:  You are not able to take your medicines as told.  Your symptoms do not get better.  Your symptoms get worse. Get help right away if:  Your attacks seem different than your normal attacks.  You have thoughts about hurting yourself or others.  You take panic attack medicine and you have a side effect. This information is not intended to replace advice given to you by your health care provider. Make sure you discuss any questions you have with your health care provider. Document Released: 05/07/2010 Document Revised: 09/10/2015 Document Reviewed: 11/16/2012 Elsevier Interactive Patient Education  2017 [employer].

## 2016-09-29 NOTE — Progress Notes (Signed)
Jerome Turner. is a 31 y.o. male who presents to [facility] [place] today for follow-up anxiety. Patient was seen about a month ago for panic attacks. Previously he had been on several different SSRI-type medications none of which she tolerated well. I prescribed clonazepam which she has been taking twice daily. Notes this has worked extremely well. Additionally he has completed genetic testing Bill Salinas) for potential tolerability of different SSRI-type medications.   Past Medical History:  Diagnosis Date  . GAD (generalized anxiety disorder) 01/06/2015   No past surgical history on file. Social History  Substance Use Topics  . Smoking status: Unknown If Ever Smoked  . Smokeless tobacco: Current User    Types: Chew  . Alcohol use No   family history includes Asthma in his maternal grandmother; Cancer in his maternal grandfather; Diabetes in his father, maternal grandfather, and paternal grandfather; Hearing loss in his father; Heart disease in his mother and paternal grandfather; Hypertension in his maternal grandfather and mother; Kidney disease in his paternal grandfather.  ROS as above:  Medications: Current Outpatient Prescriptions  Medication Sig Dispense Refill  . clonazePAM (KLONOPIN) 0.5 MG tablet Take 1 tablet (0.5 mg total) by mouth 2 (two) times daily as needed for anxiety. 60 tablet 2  . fluticasone (FLONASE) 50 MCG/ACT nasal spray Place 2 sprays into both nostrils daily. 16 g 12  . loratadine (CLARITIN) 10 MG tablet Take 1 tablet (10 mg total) by mouth daily. 90 tablet 3  . omeprazole (PRILOSEC) 40 MG capsule Take 1 capsule (40 mg total) by mouth daily. 90 capsule 3  . Vilazodone HCl (VIIBRYD STARTER PACK) 10 & 20 MG KIT Take 1 tablet by mouth daily. Use as directed, 4 weeks QS 1 kit 0   No current facility-administered medications for this visit.     Allergies  Allergen Reactions  . Penicillins   . Sulfanilamide     Health Maintenance Health Maintenance  Topic Date Due  . TETANUS/TDAP  05/02/2005  . HIV Screening  09/14/2017 (Originally 05/02/2001)  . INFLUENZA VACCINE  11/16/2016     Exam:  BP 135/80   Pulse 79   Ht 6' (1.829 m)   Wt 181 lb (82.1 kg)   BMI 24.55 kg/m  Gen: Well NAD HEENT: EOMI,  MMM Lungs: Normal work of breathing. CTABL Heart: RRR no MRG Abd: NABS, Soft. Nondistended, Nontender Exts: Brisk capillary refill, warm and well perfused.  Psych alert and oriented normal speech thought process and affect.  Depression screen [facility] 2/9 09/29/2016 08/29/2016  Decreased Interest 0 2  Down, Depressed, Hopeless 1 1  PHQ - 2 Score 1 3  Altered sleeping 2 0  Tired, decreased energy 1 0  Change in appetite 0 0  Feeling bad or failure about yourself  1 3  Trouble concentrating 0 1  Moving slowly or fidgety/restless 1 1  Suicidal thoughts 0 0  PHQ-9 Score 6 8   GAD 7 : Generalized Anxiety Score 09/29/2016  Nervous, Anxious, on Edge 1  Control/stop worrying 1  Worry too much - different things 1  Trouble relaxing 1  Restless 2  Easily annoyed or irritable 1  Afraid - awful might happen 0  Total GAD 7 Score 7     No results found for this or any previous visit (from the past 72 hour(s)). No results found.    Assessment and Plan: 31 y.o. male with panic. Much better controlled. Plan  to continue clonazepam. We'll start titration of Viibryd which is one of the medications that he is most likely to tolerate. Recheck in one month.   No orders of the defined types were placed in this encounter.  Meds ordered this encounter  Medications  . Vilazodone HCl (VIIBRYD STARTER PACK) 10 & 20 MG KIT    Sig: Take 1 tablet by mouth daily. Use as directed, 4 weeks QS    Dispense:  1 kit    Refill:  0  . clonazePAM (KLONOPIN) 0.5 MG tablet    Sig: Take 1 tablet (0.5 mg total) by mouth 2 (two) times daily as  needed for anxiety.    Dispense:  60 tablet    Refill:  2     Discussed warning signs or symptoms. Please see discharge instructions. Patient expresses understanding.

## 2016-10-17 ENCOUNTER — Encounter: Payer: Self-pay | Admitting: Family Medicine

## 2016-10-21 ENCOUNTER — Telehealth: Payer: Self-pay

## 2016-10-21 MED ORDER — VILAZODONE HCL 10 & 20 MG PO KIT: 1.0000 | PACK | Freq: Every day | ORAL | 0 refills | Status: DC

## 2016-10-21 NOTE — Telephone Encounter (Signed)
PA for viibryd was denied.  Here is a list of meds that has a 97% chance of being covered by insurance.   1. Escitalopram 2. Duloxetine 3. Paroxetine 4. Vanlafaxine 5. Sertraline 6. Fluoxetine 7. Citalopram  Key: [id number]

## 2016-10-26 MED ORDER — DESVENLAFAXINE ER 50 MG PO TB24: 50.0000 mg | ORAL_TABLET | Freq: Every day | ORAL | 1 refills | Status: DC

## 2016-10-26 NOTE — Telephone Encounter (Signed)
Pt has tried and failed Strattera, Paxil, Celexa, Effexor, Wellbutrin, Zoloft in the past.  We will try using desvenlafaxine.   This is based on genesight test.

## 2016-11-01 ENCOUNTER — Ambulatory Visit (INDEPENDENT_AMBULATORY_CARE_PROVIDER_SITE_OTHER): Payer: BLUE CROSS/BLUE SHIELD | Admitting: Family Medicine

## 2016-11-01 ENCOUNTER — Encounter: Payer: Self-pay | Admitting: Family Medicine

## 2016-11-01 VITALS — BP 131/73 | HR 88 | Ht 72.0 in | Wt 182.0 lb

## 2016-11-01 DIAGNOSIS — M62838 Other muscle spasm: Secondary | ICD-10-CM | POA: Diagnosis not present

## 2016-11-01 DIAGNOSIS — F411 Generalized anxiety disorder: Secondary | ICD-10-CM | POA: Diagnosis not present

## 2016-11-01 DIAGNOSIS — F410 Panic disorder [episodic paroxysmal anxiety] without agoraphobia: Secondary | ICD-10-CM

## 2016-11-01 MED ORDER — SERTRALINE HCL 25 MG PO TABS: ORAL_TABLET | ORAL | 1 refills | Status: DC

## 2016-11-01 MED ORDER — CLONAZEPAM 0.5 MG PO TABS: 0.5000 mg | ORAL_TABLET | Freq: Two times a day (BID) | ORAL | 2 refills | Status: DC | PRN

## 2016-11-01 MED ORDER — CYCLOBENZAPRINE HCL 10 MG PO TABS: 10.0000 mg | ORAL_TABLET | Freq: Three times a day (TID) | ORAL | 6 refills | Status: DC | PRN

## 2016-11-01 NOTE — Patient Instructions (Signed)
Thank you for coming in today. Start Zoloft daily.  After 1 week increase to 50mg  daily (2 pills).  The plan is to go to 100mg  daily over the next few weeks.  Send me a mychart message in 2-3 weeks and we will send in 100mg  pills.   Sertraline tablets What is this medicine? SERTRALINE (SER tra leen) is used to treat depression. It may also be used to treat obsessive compulsive disorder, panic disorder, post-trauma stress, premenstrual dysphoric disorder (PMDD) or social anxiety. This medicine may be used for other purposes; ask your health care provider or pharmacist if you have questions. COMMON BRAND NAME(S): Zoloft What should I tell my health care provider before I take this medicine? They need to know if you have any of these conditions: -bleeding disorders -bipolar disorder or a family history of bipolar disorder -glaucoma -heart disease -high blood pressure -history of irregular heartbeat -history of low levels of calcium, magnesium, or potassium in the blood -if you often drink alcohol -liver disease -receiving electroconvulsive therapy -seizures -suicidal thoughts, plans, or attempt; a previous suicide attempt by you or a family member -take medicines that treat or prevent blood clots -thyroid disease -an unusual or allergic reaction to sertraline, other medicines, foods, dyes, or preservatives -pregnant or trying to get pregnant -breast-feeding How should I use this medicine? Take this medicine by mouth with a glass of water. Follow the directions on the prescription label. You can take it with or without food. Take your medicine at regular intervals. Do not take your medicine more often than directed. Do not stop taking this medicine suddenly except upon the advice of your doctor. Stopping this medicine too quickly may cause serious side effects or your condition may worsen. A special MedGuide will be given to you by the pharmacist with each prescription and refill. Be sure  to read this information carefully each time. Talk to your pediatrician regarding the use of this medicine in children. While this drug may be prescribed for children as young as 7 years for selected conditions, precautions do apply. Overdosage: If you think you have taken too much of this medicine contact a poison control center or emergency room at once. NOTE: This medicine is only for you. Do not share this medicine with others. What if I miss a dose? If you miss a dose, take it as soon as you can. If it is almost time for your next dose, take only that dose. Do not take double or extra doses. What may interact with this medicine? Do not take this medicine with any of the following medications: -cisapride -dofetilide -dronedarone -linezolid -MAOIs like Carbex, Eldepryl, Marplan, Nardil, and Parnate -methylene blue (injected into a vein) -pimozide -thioridazine This medicine may also interact with the following medications: -alcohol -amphetamines -aspirin and aspirin-like medicines -certain medicines for depression, anxiety, or psychotic disturbances -certain medicines for fungal infections like ketoconazole, fluconazole, posaconazole, and itraconazole -certain medicines for irregular heart beat like flecainide, quinidine, propafenone -certain medicines for migraine headaches like almotriptan, eletriptan, frovatriptan, naratriptan, rizatriptan, sumatriptan, zolmitriptan -certain medicines for sleep -certain medicines for seizures like carbamazepine, valproic acid, phenytoin -certain medicines that treat or prevent blood clots like warfarin, enoxaparin, dalteparin -cimetidine -digoxin -diuretics -fentanyl -isoniazid -lithium -NSAIDs, medicines for pain and inflammation, like ibuprofen or naproxen -other medicines that prolong the QT interval (cause an abnormal heart rhythm) -rasagiline -safinamide -supplements like St. John's wort, kava kava,  valerian -tolbutamide -tramadol -tryptophan This list may not describe all possible interactions. Give your  health care provider a list of all the medicines, herbs, non-prescription drugs, or dietary supplements you use. Also tell them if you smoke, drink alcohol, or use illegal drugs. Some items may interact with your medicine. What should I watch for while using this medicine? Tell your doctor if your symptoms do not get better or if they get worse. Visit your doctor or health care professional for regular checks on your progress. Because it may take several weeks to see the full effects of this medicine, it is important to continue your treatment as prescribed by your doctor. Patients and their families should watch out for new or worsening thoughts of suicide or depression. Also watch out for sudden changes in feelings such as feeling anxious, agitated, panicky, irritable, hostile, aggressive, impulsive, severely restless, overly excited and hyperactive, or not being able to sleep. If this happens, especially at the beginning of treatment or after a change in dose, call your health care professional. Dennis Bast may get drowsy or dizzy. Do not drive, use machinery, or do anything that needs mental alertness until you know how this medicine affects you. Do not stand or sit up quickly, especially if you are an older patient. This reduces the risk of dizzy or fainting spells. Alcohol may interfere with the effect of this medicine. Avoid alcoholic drinks. Your mouth may get dry. Chewing sugarless gum or sucking hard candy, and drinking plenty of water may help. Contact your doctor if the problem does not go away or is severe. What side effects may I notice from receiving this medicine? Side effects that you should report to your doctor or health care professional as soon as possible: -allergic reactions like skin rash, itching or hives, swelling of the face, lips, or tongue -anxious -black, tarry  stools -changes in vision -confusion -elevated mood, decreased need for sleep, racing thoughts, impulsive behavior -eye pain -fast, irregular heartbeat -feeling faint or lightheaded, falls -feeling agitated, angry, or irritable -hallucination, loss of contact with reality -loss of balance or coordination -loss of memory -painful or prolonged erections -restlessness, pacing, inability to keep still -seizures -stiff muscles -suicidal thoughts or other mood changes -trouble sleeping -unusual bleeding or bruising -unusually weak or tired -vomiting Side effects that usually do not require medical attention (report to your doctor or health care professional if they continue or are bothersome): -change in appetite or weight -change in sex drive or performance -diarrhea -increased sweating -indigestion, nausea -tremors This list may not describe all possible side effects. Call your doctor for medical advice about side effects. You may report side effects to FDA at 1-800-FDA-1088. Where should I keep my medicine? Keep out of the reach of children. Store at room temperature between 15 and 30 degrees C (59 and 86 degrees F). Throw away any unused medicine after the expiration date. NOTE: This sheet is a summary. It may not cover all possible information. If you have questions about this medicine, talk to your doctor, pharmacist, or health care provider.  2018 Elsevier/Gold Standard (2016-04-08 14:17:49)

## 2016-11-01 NOTE — Progress Notes (Signed)
Jerome Turner. is a 31 y.o. male who presents to [facility] today for anxiety.  Patient has anxiety disorder with panic attacks. This is been managed with Klonopin. We are trying to transition to an SSRI-type medicine that he can take. He's had problems with medications in the past and had trouble tolerating multiple other medications. He recently had a GeneSight test (see scanned documents). He notes that Vybriid and Prestiq are expensive and would like to try Zoloft (on the approved GeneSight List).  He notes his anxiety symptoms are currently well controlled with Klonopin.  Additionally patient has a history of muscle spasms of the right trapezius. In the past she's done well with intermittent Flexeril. He would like a refill if possible to take occasionally.  Past Medical History:  Diagnosis Date  . GAD (generalized anxiety disorder) 01/06/2015   No past surgical history on file. Social History  Substance Use Topics  . Smoking status: Unknown If Ever Smoked  . Smokeless tobacco: Current User    Types: Chew  . Alcohol use No   family history includes Asthma in his maternal grandmother; Cancer in his maternal grandfather; Diabetes in his father, maternal grandfather, and paternal grandfather; Hearing loss in his father; Heart disease in his mother and paternal grandfather; Hypertension in his maternal grandfather and mother; Kidney disease in his paternal grandfather.  ROS as above:  Medications: Current Outpatient Prescriptions  Medication Sig Dispense Refill  . clonazePAM (KLONOPIN) 0.5 MG tablet Take 1 tablet (0.5 mg total) by mouth 2 (two) times daily as needed for anxiety. 60 tablet 2  . fluticasone (FLONASE) 50 MCG/ACT nasal spray Place 2 sprays into both nostrils daily. 16 g 12  . loratadine (CLARITIN) 10 MG tablet Take 1 tablet (10 mg total) by mouth  daily. 90 tablet 3  . omeprazole (PRILOSEC) 40 MG capsule Take 1 capsule (40 mg total) by mouth daily. 90 capsule 3  . cyclobenzaprine (FLEXERIL) 10 MG tablet Take 1 tablet (10 mg total) by mouth 3 (three) times daily as needed for muscle spasms. 30 tablet 6  . sertraline (ZOLOFT) 25 MG tablet Take 1 pill po daily for 1 week then increase to 2 pill po daily. 30 tablet 1   No current facility-administered medications for this visit.    Allergies  Allergen Reactions  . Penicillins   . Sulfanilamide     Health Maintenance Health Maintenance  Topic Date Due  . TETANUS/TDAP  05/02/2005  . HIV Screening  09/14/2017 (Originally 05/02/2001)  . INFLUENZA VACCINE  11/16/2016     Exam:  BP 131/73   Pulse 88   Ht 6' (1.829 m)   Wt 182 lb (82.6 kg)   BMI 24.68 kg/m  Gen: Well NAD HEENT: EOMI,  MMM Lungs: Normal work of breathing. CTABL Heart: RRR no MRG Abd: NABS, Soft. Nondistended, Nontender Exts: Brisk capillary refill, warm and well perfused.  Psych: Alert and oriented normal speech thought process and affect.  Depression screen [facility] 2/9 11/01/2016 09/29/2016 08/29/2016  Decreased Interest 1 0 2  Down, Depressed, Hopeless 0 1 1  PHQ - 2 Score 1 1 3   Altered sleeping 3 2 0  Tired, decreased energy 0 1 0  Change in appetite 0 0 0  Feeling bad or failure about yourself  0 1 3  Trouble concentrating 1 0 1  Moving slowly or fidgety/restless 0 1 1  Suicidal thoughts 0 0 0  PHQ-9 Score 5 6 8    GAD 7 : Generalized Anxiety Score 11/01/2016 09/29/2016  Nervous, Anxious, on Edge 2 1  Control/stop worrying 2 1  Worry too much - different things 3 1  Trouble relaxing 3 1  Restless 2 2  Easily annoyed or irritable 1 1  Afraid - awful might happen 1 0  Total GAD 7 Score 14 7  Anxiety Difficulty Somewhat difficult -       No results found for this or any previous visit (from the past 72 hour(s)). No results found.    Assessment and Plan: 31 y.o. male with anxiety: Doing  reasonably well. Continue Klonopin. Start Zoloft taper. Recheck via my chart in 2-4 weeks. Office visit should be in about 3 months. Plan to titrate Zoloft to 100 mg.   If Patient is doing well in 243 weeks okay to send in 100 mg tablets for 3 months with one refill.  Muscle spasms: Refill Flexeril as needed  No orders of the defined types were placed in this encounter.  Meds ordered this encounter  Medications  . sertraline (ZOLOFT) 25 MG tablet    Sig: Take 1 pill po daily for 1 week then increase to 2 pill po daily.    Dispense:  30 tablet    Refill:  1  . DISCONTD: clonazePAM (KLONOPIN) 0.5 MG tablet    Sig: Take 1 tablet (0.5 mg total) by mouth 2 (two) times daily as needed for anxiety.    Dispense:  60 tablet    Refill:  2  . DISCONTD: cyclobenzaprine (FLEXERIL) 10 MG tablet    Sig: Take 1 tablet (10 mg total) by mouth 3 (three) times daily as needed for muscle spasms.    Dispense:  30 tablet    Refill:  6  . cyclobenzaprine (FLEXERIL) 10 MG tablet    Sig: Take 1 tablet (10 mg total) by mouth 3 (three) times daily as needed for muscle spasms.    Dispense:  30 tablet    Refill:  6  . clonazePAM (KLONOPIN) 0.5 MG tablet    Sig: Take 1 tablet (0.5 mg total) by mouth 2 (two) times daily as needed for anxiety.    Dispense:  60 tablet    Refill:  2     Discussed warning signs or symptoms. Please see discharge instructions. Patient expresses understanding.

## 2016-11-22 ENCOUNTER — Encounter: Payer: Self-pay | Admitting: Family Medicine

## 2016-11-22 MED ORDER — SERTRALINE HCL 100 MG PO TABS: 100.0000 mg | ORAL_TABLET | Freq: Every day | ORAL | 1 refills | Status: DC

## 2017-03-02 ENCOUNTER — Other Ambulatory Visit: Payer: Self-pay | Admitting: Family Medicine

## 2017-03-20 ENCOUNTER — Encounter: Payer: Self-pay | Admitting: Family Medicine

## 2017-03-21 MED ORDER — SERTRALINE HCL 100 MG PO TABS: 100.0000 mg | ORAL_TABLET | Freq: Every day | ORAL | 3 refills | Status: DC

## 2017-03-21 MED ORDER — CLONAZEPAM 0.5 MG PO TABS: 0.5000 mg | ORAL_TABLET | Freq: Two times a day (BID) | ORAL | 2 refills | Status: DC | PRN

## 2017-07-20 ENCOUNTER — Encounter: Payer: Self-pay | Admitting: Family Medicine

## 2017-07-21 MED ORDER — CLONAZEPAM 0.5 MG PO TABS: 0.5000 mg | ORAL_TABLET | Freq: Two times a day (BID) | ORAL | 1 refills | Status: DC | PRN

## 2017-11-30 ENCOUNTER — Encounter: Payer: Self-pay | Admitting: Family Medicine

## 2018-01-11 ENCOUNTER — Ambulatory Visit (INDEPENDENT_AMBULATORY_CARE_PROVIDER_SITE_OTHER): Payer: 59 | Admitting: Family Medicine

## 2018-01-11 ENCOUNTER — Encounter: Payer: Self-pay | Admitting: Family Medicine

## 2018-01-11 VITALS — BP 136/79 | HR 85 | Ht 72.0 in | Wt 183.0 lb

## 2018-01-11 DIAGNOSIS — Z6824 Body mass index (BMI) 24.0-24.9, adult: Secondary | ICD-10-CM

## 2018-01-11 DIAGNOSIS — F411 Generalized anxiety disorder: Secondary | ICD-10-CM | POA: Diagnosis not present

## 2018-01-11 DIAGNOSIS — D229 Melanocytic nevi, unspecified: Secondary | ICD-10-CM

## 2018-01-11 DIAGNOSIS — I10 Essential (primary) hypertension: Secondary | ICD-10-CM | POA: Diagnosis not present

## 2018-01-11 DIAGNOSIS — Z0000 Encounter for general adult medical examination without abnormal findings: Secondary | ICD-10-CM | POA: Diagnosis not present

## 2018-01-11 DIAGNOSIS — N529 Male erectile dysfunction, unspecified: Secondary | ICD-10-CM

## 2018-01-11 DIAGNOSIS — Z23 Encounter for immunization: Secondary | ICD-10-CM | POA: Diagnosis not present

## 2018-01-11 MED ORDER — SERTRALINE HCL 100 MG PO TABS: 150.0000 mg | ORAL_TABLET | Freq: Every day | ORAL | 3 refills | Status: DC

## 2018-01-11 MED ORDER — OMEPRAZOLE 40 MG PO CPDR: 40.0000 mg | DELAYED_RELEASE_CAPSULE | Freq: Every day | ORAL | 3 refills | Status: AC

## 2018-01-11 MED ORDER — TADALAFIL 20 MG PO TABS: 10.0000 mg | ORAL_TABLET | ORAL | 11 refills | Status: AC | PRN

## 2018-01-11 NOTE — Progress Notes (Signed)
Jerome Turner. is a 32 y.o. male who presents to [facility] today for well adult visit.  Jerome Turner is doing well overall.  He is currently taking Zoloft 100 mg daily, omeprazole 40 mg daily, and the loratadine and fluticasone nasal spray.  He is doing well and is happy with how his life is going.  He had a promotion at work and is satisfied with his job.  He notes he has had less opportunity to exercise with a job promotion however.  He notes his anxiety has worsened just slightly and he is interested in increasing his Zoloft dose if he can.  Additionally he notes some mild erectile dysfunction.  He was able to use 1 of the advertised mail-order companies and got some Viagra which he says has been quite helpful as well.  He would like a prescription of a Viagra or Cialis if possible.   ROS as above:  Past Medical History:  Diagnosis Date  . Benign pigmented mole 05/11/2016   Left back  . GAD (generalized anxiety disorder) 01/06/2015  . HTN (hypertension) 01/06/2015   History reviewed. No pertinent surgical history. Social History   Tobacco Use  . Smoking status: Unknown If Ever Smoked  . Smokeless tobacco: Current User    Types: Chew  Substance Use Topics  . Alcohol use: No    Alcohol/week: 0.0 standard drinks   family history includes Asthma in his maternal grandmother; Cancer in his maternal grandfather; Diabetes in his father, maternal grandfather, and paternal grandfather; Hearing loss in his father; Heart disease in his mother and paternal grandfather; Hypertension in his maternal grandfather and mother; Kidney disease in his paternal grandfather.  Medications: Current Outpatient Medications  Medication Sig Dispense Refill  . cyclobenzaprine (FLEXERIL) 10 MG tablet Take 1 tablet (10 mg total) by mouth 3 (three) times daily as needed for muscle spasms. 30  tablet 6  . fluticasone (FLONASE) 50 MCG/ACT nasal spray Place 2 sprays into both nostrils daily. 16 g 12  . loratadine (CLARITIN) 10 MG tablet Take 1 tablet (10 mg total) by mouth daily. 90 tablet 3  . omeprazole (PRILOSEC) 40 MG capsule Take 1 capsule (40 mg total) by mouth daily. 90 capsule 3  . sertraline (ZOLOFT) 100 MG tablet Take 1.5 tablets (150 mg total) by mouth daily. 135 tablet 3  . tadalafil (ADCIRCA/CIALIS) 20 MG tablet Take 0.5-1 tablets (10-20 mg total) by mouth every other day as needed for erectile dysfunction. 30 tablet 11   No current facility-administered medications for this visit.    Allergies  Allergen Reactions  . Penicillins   . Sulfanilamide     Health Maintenance Health Maintenance  Topic Date Due  . INFLUENZA VACCINE  01/12/2019 (Originally 11/16/2017)  . TETANUS/TDAP  01/12/2028  . HIV Screening  Completed     Exam:  BP 136/79   Pulse 85   Ht 6' (1.829 m)   Wt 183 lb (83 kg)   BMI 24.82 kg/m  Wt Readings from Last 5 Encounters:  01/11/18 183 lb (83 kg)  11/01/16 182 lb (82.6 kg)  09/29/16 181 lb (82.1 kg)  08/25/16 182 lb (82.6 kg)  05/10/16 191 lb (86.6 kg)      Gen: Well NAD HEENT: EOMI,  MMM Lungs: Normal work of breathing. CTABL Heart: RRR no MRG Abd: NABS, Soft. Nondistended, Nontender Exts: Brisk capillary refill, warm and well perfused.  Psych: Alert and oriented normal speech  thought process and affect. Skin: Several non-dysplastic appearing nevi on trunk no significant change from prior observation.  Depression screen [facility] 2/9 01/11/2018 11/01/2016 09/29/2016 08/29/2016  Decreased Interest 0 1 0 2  Down, Depressed, Hopeless 1 0 1 1  PHQ - 2 Score 1 1 1 3   Altered sleeping 3 3 2  0  Tired, decreased energy 2 0 1 0  Change in appetite 0 0 0 0  Feeling bad or failure about yourself  0 0 1 3  Trouble concentrating 0 1 0 1  Moving slowly or fidgety/restless 0 0 1 1  Suicidal thoughts 0 0 0 0  PHQ-9 Score 6 5 6 8   Difficult doing  work/chores Somewhat difficult - - -       Assessment and Plan: 32 y.o. male with well adult.  Doing reasonably well. Mild increase in anxiety mostly due to increased job stress.  Plan to increase Zoloft to 150 mg.  Additionally patient has some mild erectile dysfunction.  Plan to use Cialis trial.  Recheck if not improving.  Check basic fasting labs as well for general health maintenance.  We will send results along with biometric form to employer.  Blood pressure mildly elevated but not in the needing treatment range at this time.  Watchful waiting lifestyle modification.  No significant change in moles.  Orders Placed This Encounter  Procedures  . Tdap vaccine greater than or equal to 7yo IM  . CBC  . COMPLETE METABOLIC PANEL WITH GFR  . Lipid Panel w/reflex Direct LDL   Meds ordered this encounter  Medications  . tadalafil (ADCIRCA/CIALIS) 20 MG tablet    Sig: Take 0.5-1 tablets (10-20 mg total) by mouth every other day as needed for erectile dysfunction.    Dispense:  30 tablet    Refill:  11  . sertraline (ZOLOFT) 100 MG tablet    Sig: Take 1.5 tablets (150 mg total) by mouth daily.    Dispense:  135 tablet    Refill:  3  . omeprazole (PRILOSEC) 40 MG capsule    Sig: Take 1 capsule (40 mg total) by mouth daily.    Dispense:  90 capsule    Refill:  3     Discussed warning signs or symptoms. Please see discharge instructions. Patient expresses understanding.

## 2018-01-11 NOTE — Patient Instructions (Addendum)
Thank you for coming in today. Get fasting labs tomorrow.  Recheck yearly if all is well.  Keep track of blood pressure.  Return sooner if needed.  Try to get some exercise in your week.

## 2018-01-12 LAB — COMPLETE METABOLIC PANEL WITH GFR
AG RATIO: 1.9 (calc) (ref 1.0–2.5)
ALBUMIN MSPROF: 4.5 g/dL (ref 3.6–5.1)
ALT: 29 U/L (ref 9–46)
AST: 27 U/L (ref 10–40)
Alkaline phosphatase (APISO): 92 U/L (ref 40–115)
BILIRUBIN TOTAL: 1.1 mg/dL (ref 0.2–1.2)
BUN: 7 mg/dL (ref 7–25)
CALCIUM: 9.7 mg/dL (ref 8.6–10.3)
CHLORIDE: 105 mmol/L (ref 98–110)
CO2: 28 mmol/L (ref 20–32)
Creat: 0.64 mg/dL (ref 0.60–1.35)
GFR, EST AFRICAN AMERICAN: 151 mL/min/{1.73_m2} (ref >=60)
GFR, Est Non African American: 130 mL/min/{1.73_m2} (ref >=60)
GLOBULIN: 2.4 g/dL (ref 1.9–3.7)
Glucose, Bld: 89 mg/dL (ref 65–99)
POTASSIUM: 4 mmol/L (ref 3.5–5.3)
SODIUM: 140 mmol/L (ref 135–146)
TOTAL PROTEIN: 6.9 g/dL (ref 6.1–8.1)

## 2018-01-12 LAB — CBC
HEMATOCRIT: 46.9 % (ref 38.5–50.0)
HEMOGLOBIN: 16 g/dL (ref 13.2–17.1)
MCH: 30.6 pg (ref 27.0–33.0)
MCHC: 34.1 g/dL (ref 32.0–36.0)
MCV: 89.7 fL (ref 80.0–100.0)
MPV: 10.5 fL (ref 7.5–12.5)
PLATELETS: 300 10*3/uL (ref 140–400)
RBC: 5.23 10*6/uL (ref 4.20–5.80)
RDW: 12.2 % (ref 11.0–15.0)
WBC: 7.7 10*3/uL (ref 3.8–10.8)

## 2018-01-12 LAB — LIPID PANEL W/REFLEX DIRECT LDL
Cholesterol: 184 mg/dL (ref <200)
HDL: 49 mg/dL (ref >40)
LDL Cholesterol (Calc): 106 mg/dL (calc) — ABNORMAL HIGH
Non-HDL Cholesterol (Calc): 135 mg/dL (calc) — ABNORMAL HIGH (ref <130)
TRIGLYCERIDES: 169 mg/dL — AB (ref <150)
Total CHOL/HDL Ratio: 3.8 (calc) (ref <5.0)

## 2018-01-14 ENCOUNTER — Encounter: Payer: Self-pay | Admitting: Family Medicine

## 2018-04-26 ENCOUNTER — Ambulatory Visit: Payer: 59 | Admitting: Family Medicine

## 2018-06-17 IMAGING — DX DG CHEST 2V
2 series · 2 of 2 positions shown · non-contrast
Comparison: None.

CLINICAL DATA: Left chest wall pain.  Shortness of breath.

EXAM:
CHEST  2 VIEW

[chest pa]
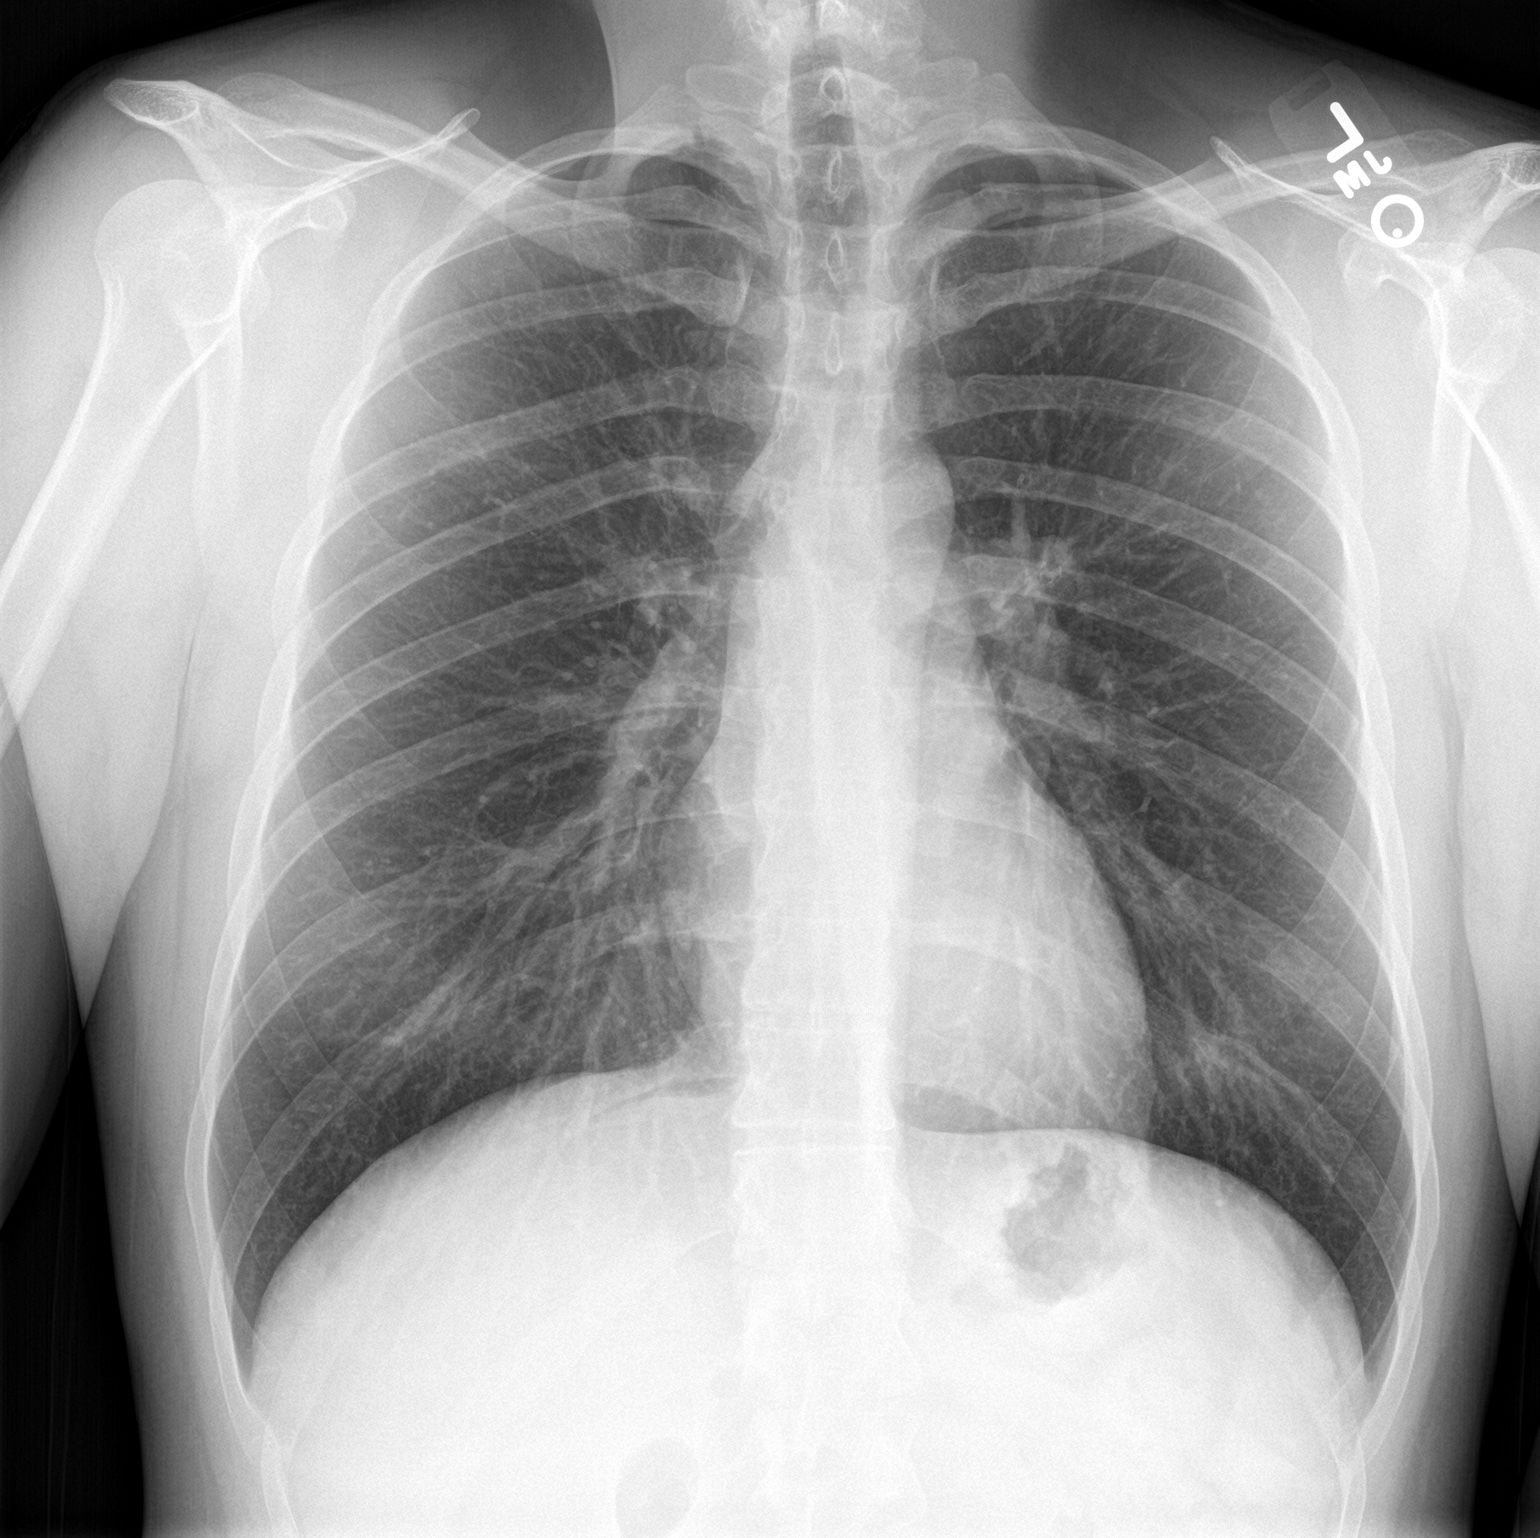

[chest lat]
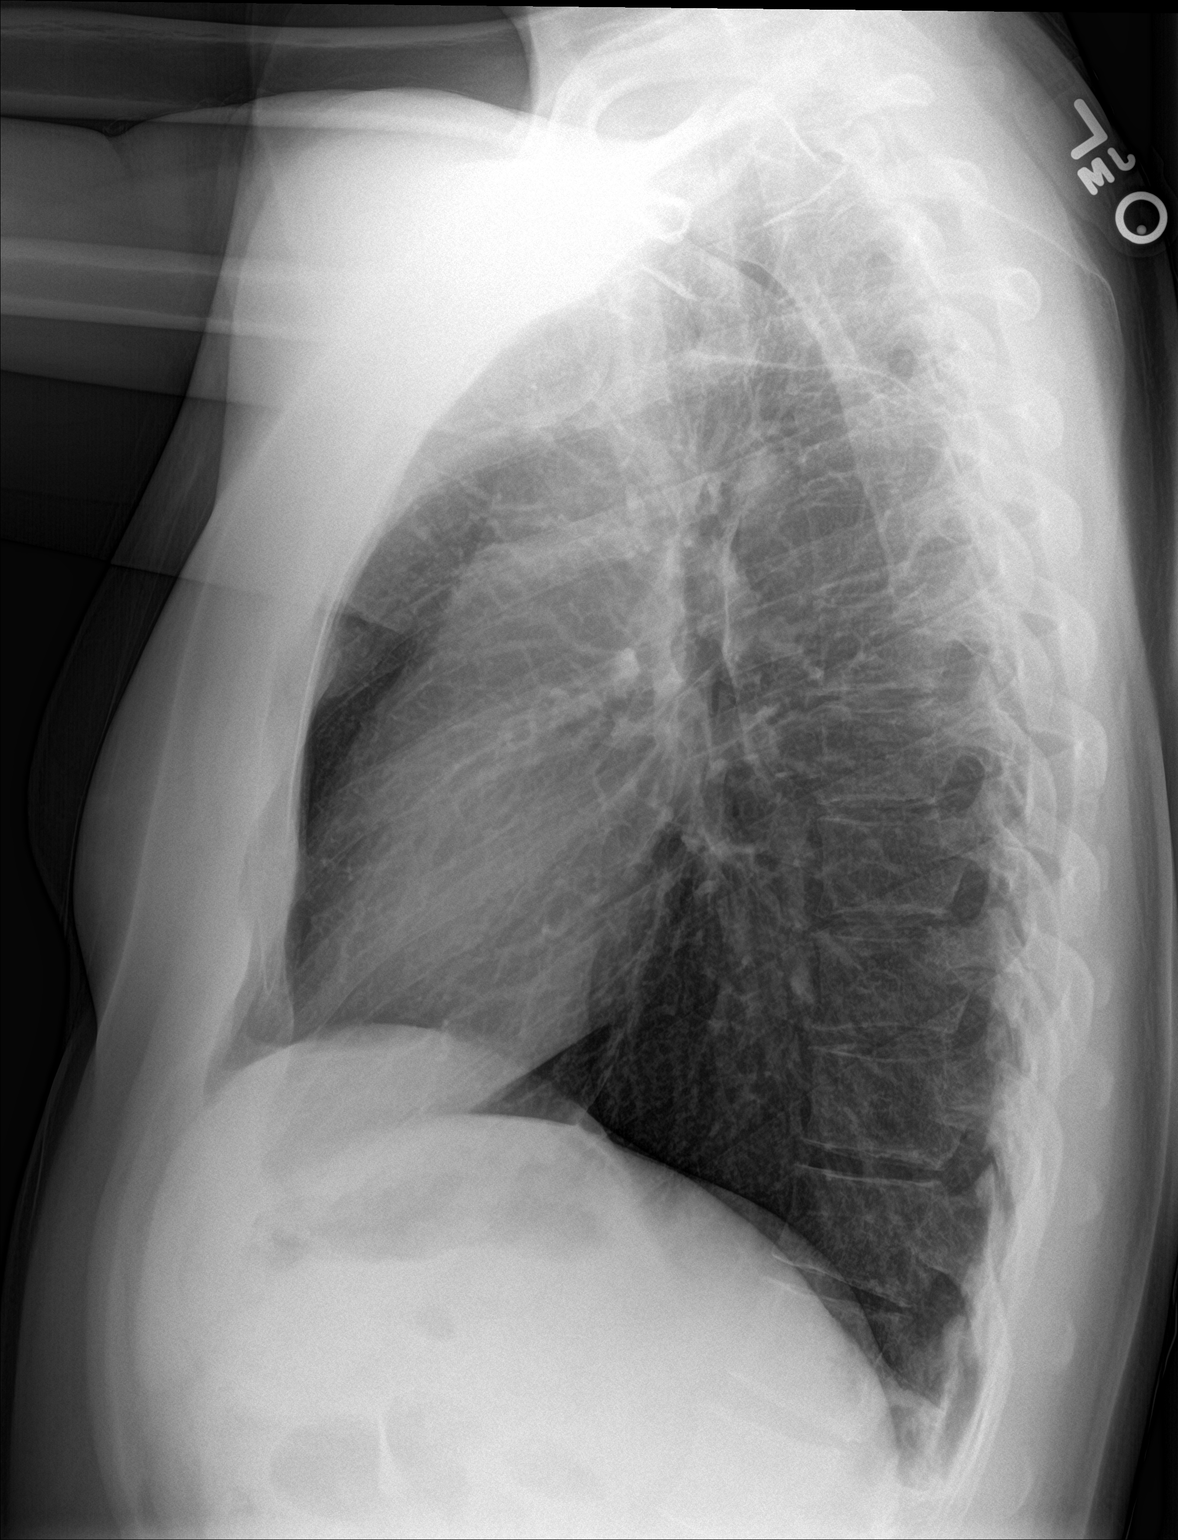

[2 of 2 positions shown; findings below may reference images not displayed]

FINDINGS: Normal heart size and mediastinal contours. No acute infiltrate or
edema. No effusion or pneumothorax. No acute osseous findings.
IMPRESSION: Negative chest.

## 2018-08-07 ENCOUNTER — Encounter: Payer: Self-pay | Admitting: Family Medicine

## 2018-08-17 ENCOUNTER — Encounter: Payer: Self-pay | Admitting: Family Medicine

## 2018-08-17 ENCOUNTER — Telehealth (INDEPENDENT_AMBULATORY_CARE_PROVIDER_SITE_OTHER): Payer: 59 | Admitting: Family Medicine

## 2018-08-17 VITALS — Ht 72.0 in

## 2018-08-17 DIAGNOSIS — F411 Generalized anxiety disorder: Secondary | ICD-10-CM

## 2018-08-17 MED ORDER — CLONAZEPAM 0.5 MG PO TABS: 0.5000 mg | ORAL_TABLET | Freq: Two times a day (BID) | ORAL | 2 refills | Status: AC | PRN

## 2018-08-17 NOTE — Progress Notes (Signed)
Virtual Visit  via Video Note  I connected with      Jer[facility]. by a video enabled telemedicine application and verified that I am speaking with the correct person using two identifiers.   I discussed the limitations of evaluation and management by telemedicine and the availability of in person appointments. The patient expressed understanding and agreed to proceed.  History of Present Illness: [facility]. is a 33 y.o. male who would like to discuss anxiety.    Anxiety:  Merlon has a history of anxiety that has been pretty well controlled in the past with sertraline.  Initially when he was having more panic symptoms he had a prescription for Klonopin but was able to wean off of that with better baseline control with SSRIs.  Unfortunately [employer] was laid off from work a few weeks ago as result of the COVID-19 pandemic economic downturn.  He notes increasing anxiety symptoms and is interested in temporarily restarting Klonopin.  He is currently looking for another job and is optimistic that things will get better soon.   Observations/Objective: Ht 6' (1.829 m)   BMI 24.82 kg/m  Wt Readings from Last 5 Encounters:  01/11/18 183 lb (83 kg)  11/01/16 182 lb (82.6 kg)  09/29/16 181 lb (82.1 kg)  08/25/16 182 lb (82.6 kg)  05/10/16 191 lb (86.6 kg)   Exam:  Normal Speech.  Psych: Oriented normal speech thought process and affect.  No SI or HI.  GAD 7 : Generalized Anxiety Score 08/17/2018 01/11/2018 01/11/2018 11/01/2016  Nervous, Anxious, on Edge 3 2 2 2   Control/stop worrying 2 1 1 2   Worry too much - different things 2 1 1 3   Trouble relaxing 3 1 1 3   Restless 2 1 1 2   Easily annoyed or irritable 0 0 0 1  Afraid - awful might happen 3 0 0 1  Total GAD 7 Score 15 6 6 14   Anxiety Difficulty Not difficult at all Somewhat difficult Somewhat difficult Somewhat difficult     Lab and Radiology Results No results found for this or any previous visit (from the past 72  hour(s)). No results found.   Assessment and Plan: 33 y.o. male with anxiety worsened recently due to life turmoil.  Plan to continue Zoloft and restart limited Klonopin.  Recheck in 1 month.  Discussed counseling will hold off on that for now.  Return sooner if needed.  PDMP reviewed during this encounter. No orders of the defined types were placed in this encounter.  Meds ordered this encounter  Medications  . clonazePAM (KLONOPIN) 0.5 MG tablet    Sig: Take 1 tablet (0.5 mg total) by mouth 2 (two) times daily as needed for anxiety.    Dispense:  60 tablet    Refill:  2    Follow Up Instructions:    I discussed the assessment and treatment plan with the patient. The patient was provided an opportunity to ask questions and all were answered. The patient agreed with the plan and demonstrated an understanding of the instructions.   The patient was advised to call back or seek an in-person evaluation if the symptoms worsen or if the condition fails to improve as anticipated.  Time: 15 minutes of intraservice time, with >22 minutes of total time during today's visit.      Historical information moved to improve visibility of documentation.  Past Medical History:  Diagnosis Date  . Benign pigmented mole 05/11/2016  Left back  . GAD (generalized anxiety disorder) 01/06/2015  . HTN (hypertension) 01/06/2015   No past surgical history on file. Social History   Tobacco Use  . Smoking status: Unknown If Ever Smoked  . Smokeless tobacco: Current User    Types: Chew  Substance Use Topics  . Alcohol use: No    Alcohol/week: 0.0 standard drinks   family history includes Asthma in his maternal grandmother; Cancer in his maternal grandfather; Diabetes in his father, maternal grandfather, and paternal grandfather; Hearing loss in his father; Heart disease in his mother and paternal grandfather; Hypertension in his maternal grandfather and mother; Kidney disease in his paternal  grandfather.  Medications: Current Outpatient Medications  Medication Sig Dispense Refill  . clonazePAM (KLONOPIN) 0.5 MG tablet Take 1 tablet (0.5 mg total) by mouth 2 (two) times daily as needed for anxiety. 60 tablet 2  . fluticasone (FLONASE) 50 MCG/ACT nasal spray Place 2 sprays into both nostrils daily. 16 g 12  . loratadine (CLARITIN) 10 MG tablet Take 1 tablet (10 mg total) by mouth daily. 90 tablet 3  . omeprazole (PRILOSEC) 40 MG capsule Take 1 capsule (40 mg total) by mouth daily. 90 capsule 3  . sertraline (ZOLOFT) 100 MG tablet Take 1.5 tablets (150 mg total) by mouth daily. 135 tablet 3  . tadalafil (ADCIRCA/CIALIS) 20 MG tablet Take 0.5-1 tablets (10-20 mg total) by mouth every other day as needed for erectile dysfunction. 30 tablet 11   No current facility-administered medications for this visit.    Allergies  Allergen Reactions  . Penicillins   . Sulfanilamide

## 2018-08-17 NOTE — Patient Instructions (Signed)
Thank you for coming in today. Continue zoloft.  Use klonopin.  Check blood pressure.  Recheck in 1 month.  Return sooner if needed.    Generalized Anxiety Disorder, Adult Generalized anxiety disorder (GAD) is a mental health disorder. People with this condition constantly worry about everyday events. Unlike normal anxiety, worry related to GAD is not triggered by a specific event. These worries also do not fade or get better with time. GAD interferes with life functions, including relationships, work, and school. GAD can vary from mild to severe. People with severe GAD can have intense waves of anxiety with physical symptoms (panic attacks). What are the causes? The exact cause of GAD is not known. What increases the risk? This condition is more likely to develop in:  Women.  People who have a family history of anxiety disorders.  People who are very shy.  People who experience very stressful life events, such as the death of a loved one.  People who have a very stressful family environment. What are the signs or symptoms? People with GAD often worry excessively about many things in their lives, such as their health and family. They may also be overly concerned about:  Doing well at work.  Being on time.  Natural disasters.  Friendships. Physical symptoms of GAD include:  Fatigue.  Muscle tension or having muscle twitches.  Trembling or feeling shaky.  Being easily startled.  Feeling like your heart is pounding or racing.  Feeling out of breath or like you cannot take a deep breath.  Having trouble falling asleep or staying asleep.  Sweating.  Nausea, diarrhea, or irritable bowel syndrome (IBS).  Headaches.  Trouble concentrating or remembering facts.  Restlessness.  Irritability. How is this diagnosed? Your health care provider can diagnose GAD based on your symptoms and medical history. You will also have a physical exam. The health care provider will  ask specific questions about your symptoms, including how severe they are, when they started, and if they come and go. Your health care provider may ask you about your use of alcohol or drugs, including prescription medicines. Your health care provider may refer you to a mental health specialist for further evaluation. Your health care provider will do a thorough examination and may perform additional tests to rule out other possible causes of your symptoms. To be diagnosed with GAD, a person must have anxiety that:  Is out of his or her control.  Affects several different aspects of his or her life, such as work and relationships.  Causes distress that makes him or her unable to take part in normal activities.  Includes at least three physical symptoms of GAD, such as restlessness, fatigue, trouble concentrating, irritability, muscle tension, or sleep problems. Before your health care provider can confirm a diagnosis of GAD, these symptoms must be present more days than they are not, and they must last for six months or longer. How is this treated? The following therapies are usually used to treat GAD:  Medicine. Antidepressant medicine is usually prescribed for long-term daily control. Antianxiety medicines may be added in severe cases, especially when panic attacks occur.  Talk therapy (psychotherapy). Certain types of talk therapy can be helpful in treating GAD by providing support, education, and guidance. Options include: ? Cognitive behavioral therapy (CBT). People learn coping skills and techniques to ease their anxiety. They learn to identify unrealistic or negative thoughts and behaviors and to replace them with positive ones. ? Acceptance and commitment therapy (ACT). This  treatment teaches people how to be mindful as a way to cope with unwanted thoughts and feelings. ? Biofeedback. This process trains you to manage your body's response (physiological response) through breathing  techniques and relaxation methods. You will work with a therapist while machines are used to monitor your physical symptoms.  Stress management techniques. These include yoga, meditation, and exercise. A mental health specialist can help determine which treatment is best for you. Some people see improvement with one type of therapy. However, other people require a combination of therapies. Follow these instructions at home:  Take over-the-counter and prescription medicines only as told by your health care provider.  Try to maintain a normal routine.  Try to anticipate stressful situations and allow extra time to manage them.  Practice any stress management or self-calming techniques as taught by your health care provider.  Do not punish yourself for setbacks or for not making progress.  Try to recognize your accomplishments, even if they are small.  Keep all follow-up visits as told by your health care provider. This is important. Contact a health care provider if:  Your symptoms do not get better.  Your symptoms get worse.  You have signs of depression, such as: ? A persistently sad, cranky, or irritable mood. ? Loss of enjoyment in activities that used to bring you joy. ? Change in weight or eating. ? Changes in sleeping habits. ? Avoiding friends or family members. ? Loss of energy for normal tasks. ? Feelings of guilt or worthlessness. Get help right away if:  You have serious thoughts about hurting yourself or others. If you ever feel like you may hurt yourself or others, or have thoughts about taking your own life, get help right away. You can go to your nearest emergency department or call:  Your local emergency services (911 in the [place]  A suicide crisis helpline, such as the [facility] at 934-438-7540. This is open 24 hours a day. Summary  Generalized anxiety disorder (GAD) is a mental health disorder that involves worry that is not  triggered by a specific event.  People with GAD often worry excessively about many things in their lives, such as their health and family.  GAD may cause physical symptoms such as restlessness, trouble concentrating, sleep problems, frequent sweating, nausea, diarrhea, headaches, and trembling or muscle twitching.  A mental health specialist can help determine which treatment is best for you. Some people see improvement with one type of therapy. However, other people require a combination of therapies. This information is not intended to replace advice given to you by your health care provider. Make sure you discuss any questions you have with your health care provider. Document Released: 07/30/2012 Document Revised: 02/23/2016 Document Reviewed: 02/23/2016 Elsevier Interactive Patient Education  2019 [employer].

## 2018-09-17 ENCOUNTER — Encounter: Payer: Self-pay | Admitting: Family Medicine

## 2018-09-17 ENCOUNTER — Ambulatory Visit (INDEPENDENT_AMBULATORY_CARE_PROVIDER_SITE_OTHER): Payer: 59 | Admitting: Family Medicine

## 2018-09-17 VITALS — HR 102 | Temp 98.6°F | Ht 72.0 in | Wt 190.0 lb

## 2018-09-17 DIAGNOSIS — F411 Generalized anxiety disorder: Secondary | ICD-10-CM

## 2018-09-17 NOTE — Patient Instructions (Signed)
Thank you for coming in today. Continue current medicines.  Keep me updated with mychart. We will continue to follow and try to minimize costs and visits.
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
## 2019-05-06 ENCOUNTER — Encounter: Payer: Self-pay | Admitting: Family Medicine

## 2019-05-06 ENCOUNTER — Other Ambulatory Visit: Payer: Self-pay | Admitting: Family Medicine

## 2019-05-07 MED ORDER — SERTRALINE HCL 100 MG PO TABS: 100.0000 mg | ORAL_TABLET | Freq: Every day | ORAL | 3 refills | Status: AC
